# Patient Record
Sex: Female | Born: 1970 | Race: Black or African American | Hispanic: No | Smoking: Never smoker
Health system: Southern US, Community
[De-identification: ages and names within clinical notes are randomized; demographics above are authoritative.]

## PROBLEM LIST (undated history)

## (undated) DIAGNOSIS — R011 Cardiac murmur, unspecified: Secondary | ICD-10-CM

## (undated) DIAGNOSIS — G43019 Migraine without aura, intractable, without status migrainosus: Secondary | ICD-10-CM

## (undated) DIAGNOSIS — I10 Essential (primary) hypertension: Secondary | ICD-10-CM

## (undated) DIAGNOSIS — N83209 Unspecified ovarian cyst, unspecified side: Secondary | ICD-10-CM

## (undated) DIAGNOSIS — G43909 Migraine, unspecified, not intractable, without status migrainosus: Secondary | ICD-10-CM

## (undated) HISTORY — DX: Migraine, unspecified, not intractable, without status migrainosus: G43.909

## (undated) HISTORY — DX: Migraine without aura, intractable, without status migrainosus: G43.019

## (undated) HISTORY — PX: DILATION AND CURETTAGE OF UTERUS: SHX78

## (undated) HISTORY — PX: MYOMECTOMY: SHX85

## (undated) HISTORY — PX: BREAST SURGERY: SHX581

## (undated) HISTORY — PX: DIAGNOSTIC LAPAROSCOPY: SUR761

---

## 1998-08-01 ENCOUNTER — Emergency Department (HOSPITAL_COMMUNITY): Admission: EM | Admit: 1998-08-01 | Discharge: 1998-08-01 | Payer: Self-pay | Admitting: Emergency Medicine

## 2004-09-08 ENCOUNTER — Encounter (INDEPENDENT_AMBULATORY_CARE_PROVIDER_SITE_OTHER): Payer: Self-pay | Admitting: *Deleted

## 2004-09-08 ENCOUNTER — Ambulatory Visit (HOSPITAL_COMMUNITY): Admission: AD | Admit: 2004-09-08 | Discharge: 2004-09-08 | Payer: Self-pay | Admitting: *Deleted

## 2005-04-11 ENCOUNTER — Ambulatory Visit (HOSPITAL_COMMUNITY): Admission: RE | Admit: 2005-04-11 | Discharge: 2005-04-11 | Payer: Self-pay | Admitting: Obstetrics and Gynecology

## 2005-04-11 ENCOUNTER — Encounter (INDEPENDENT_AMBULATORY_CARE_PROVIDER_SITE_OTHER): Payer: Self-pay | Admitting: Specialist

## 2005-12-05 ENCOUNTER — Inpatient Hospital Stay (HOSPITAL_COMMUNITY): Admission: AD | Admit: 2005-12-05 | Discharge: 2005-12-15 | Payer: Self-pay | Admitting: Obstetrics and Gynecology

## 2005-12-22 ENCOUNTER — Inpatient Hospital Stay (HOSPITAL_COMMUNITY): Admission: AD | Admit: 2005-12-22 | Discharge: 2005-12-25 | Payer: Self-pay | Admitting: Obstetrics and Gynecology

## 2005-12-23 ENCOUNTER — Encounter (INDEPENDENT_AMBULATORY_CARE_PROVIDER_SITE_OTHER): Payer: Self-pay | Admitting: Specialist

## 2005-12-26 ENCOUNTER — Encounter: Admission: RE | Admit: 2005-12-26 | Discharge: 2006-01-22 | Payer: Self-pay | Admitting: Obstetrics and Gynecology

## 2006-01-23 ENCOUNTER — Encounter: Admission: RE | Admit: 2006-01-23 | Discharge: 2006-02-22 | Payer: Self-pay | Admitting: Obstetrics and Gynecology

## 2006-02-23 ENCOUNTER — Encounter: Admission: RE | Admit: 2006-02-23 | Discharge: 2006-03-24 | Payer: Self-pay | Admitting: Obstetrics and Gynecology

## 2006-03-25 ENCOUNTER — Encounter: Admission: RE | Admit: 2006-03-25 | Discharge: 2006-04-24 | Payer: Self-pay | Admitting: Obstetrics and Gynecology

## 2006-04-25 ENCOUNTER — Encounter: Admission: RE | Admit: 2006-04-25 | Discharge: 2006-05-24 | Payer: Self-pay | Admitting: Obstetrics and Gynecology

## 2006-05-25 ENCOUNTER — Encounter: Admission: RE | Admit: 2006-05-25 | Discharge: 2006-06-24 | Payer: Self-pay | Admitting: Obstetrics and Gynecology

## 2006-06-25 ENCOUNTER — Encounter: Admission: RE | Admit: 2006-06-25 | Discharge: 2006-07-25 | Payer: Self-pay | Admitting: Obstetrics and Gynecology

## 2006-07-26 ENCOUNTER — Encounter: Admission: RE | Admit: 2006-07-26 | Discharge: 2006-08-24 | Payer: Self-pay | Admitting: Obstetrics and Gynecology

## 2006-08-25 ENCOUNTER — Encounter: Admission: RE | Admit: 2006-08-25 | Discharge: 2006-09-24 | Payer: Self-pay | Admitting: Obstetrics and Gynecology

## 2006-09-25 ENCOUNTER — Encounter: Admission: RE | Admit: 2006-09-25 | Discharge: 2006-10-24 | Payer: Self-pay | Admitting: Obstetrics and Gynecology

## 2006-10-25 ENCOUNTER — Encounter: Admission: RE | Admit: 2006-10-25 | Discharge: 2006-11-24 | Payer: Self-pay | Admitting: Obstetrics and Gynecology

## 2006-11-25 ENCOUNTER — Encounter: Admission: RE | Admit: 2006-11-25 | Discharge: 2006-12-25 | Payer: Self-pay | Admitting: Obstetrics and Gynecology

## 2007-11-12 ENCOUNTER — Emergency Department (HOSPITAL_COMMUNITY): Admission: EM | Admit: 2007-11-12 | Discharge: 2007-11-12 | Payer: Self-pay | Admitting: Emergency Medicine

## 2009-06-17 ENCOUNTER — Emergency Department (HOSPITAL_COMMUNITY): Admission: EM | Admit: 2009-06-17 | Discharge: 2009-06-17 | Payer: Self-pay | Admitting: Emergency Medicine

## 2010-01-25 ENCOUNTER — Emergency Department (HOSPITAL_COMMUNITY): Admission: EM | Admit: 2010-01-25 | Discharge: 2010-01-25 | Payer: Self-pay | Admitting: Family Medicine

## 2010-08-23 ENCOUNTER — Encounter
Admission: RE | Admit: 2010-08-23 | Discharge: 2010-11-21 | Payer: Self-pay | Source: Home / Self Care | Attending: Family Medicine | Admitting: Family Medicine

## 2011-02-19 LAB — URINALYSIS, ROUTINE W REFLEX MICROSCOPIC
Bilirubin Urine: NEGATIVE
Ketones, ur: NEGATIVE mg/dL
Leukocytes, UA: NEGATIVE
Nitrite: NEGATIVE
Protein, ur: NEGATIVE mg/dL
Urobilinogen, UA: 2 mg/dL — ABNORMAL HIGH (ref 0.0–1.0)
pH: 6.5 (ref 5.0–8.0)

## 2011-02-19 LAB — DIFFERENTIAL
Basophils Absolute: 0 10*3/uL (ref 0.0–0.1)
Basophils Relative: 0 % (ref 0–1)
Eosinophils Relative: 0 % (ref 0–5)
Monocytes Absolute: 0.5 10*3/uL (ref 0.1–1.0)
Monocytes Relative: 11 % (ref 3–12)
Neutro Abs: 3.7 10*3/uL (ref 1.7–7.7)

## 2011-02-19 LAB — CBC
HCT: 36.2 % (ref 36.0–46.0)
Hemoglobin: 12.5 g/dL (ref 12.0–15.0)
Platelets: 224 10*3/uL (ref 150–400)
RBC: 4.2 MIL/uL (ref 3.87–5.11)

## 2011-02-19 LAB — URINE MICROSCOPIC-ADD ON

## 2011-02-19 LAB — BASIC METABOLIC PANEL
Calcium: 9 mg/dL (ref 8.4–10.5)
Creatinine, Ser: 0.57 mg/dL (ref 0.4–1.2)

## 2011-02-19 LAB — POCT PREGNANCY, URINE: Preg Test, Ur: NEGATIVE

## 2011-03-31 NOTE — Op Note (Signed)
NAME:  Kelli Tran, Kelli Tran                 ACCOUNT NO.:  [id number]   MEDICAL RECORD NO.:  [id number]          PATIENT TYPE:  MAT   LOCATION:  MATC                          [facility]:  [facility]   PHYSICIAN:  [place]. Earlene Plater, M.D.  DATE OF BIRTH:  [birth year]   DATE OF PROCEDURE:  09/08/2004  DATE OF DISCHARGE:                                 OPERATIVE REPORT   PREOPERATIVE DIAGNOSES:  Lower abdominal pain, early pregnancy with left  adnexal mass suspicious for ectopic pregnancy.   POSTOPERATIVE DIAGNOSES:  Left unruptured ectopic pregnancy.   PROCEDURE:  Open laparoscopy, left salpingostomy.   SURGEON:  Chester Holstein. Earlene Plater, M.D.   ANESTHESIA:  General.   FINDINGS:  Unruptured mid tubal left ectopic pregnancy otherwise normal  appearing tubes, ovaries, uterus, appendix, liver, gallbladder and stomach.   ESTIMATED BLOOD LOSS:  50 mL intraoperatively and about 100 mL of blood in  the pelvis at entry.   SPECIMENS:  Products of conception.   COMPLICATIONS:  None.   INDICATIONS FOR PROCEDURE:  The patient presented to the office today with  bleeding, six weeks pregnancy by last menstrual period.  Ultrasound showed a  2 cm left adnexal mass with only a small amount of fluid in the uterus and a  small amount of Kelli Tran fluid in the pelvis.  Quantitative HCG in the 700  range. The patient was complaining of increasing pain through the day and  after her quantitative results came back we discussed the situation further.  Options were discussed including following serial quantitative HCG for trend  to better diagnose versus immediate laparoscopy. Considering the patient's  pain, we decided to proceed with laparoscopy tonight. Operative risks were  discussed including infection, bleeding, damage to bowel, bladder,  surrounding organs and potential need for salpingectomy.   DESCRIPTION OF PROCEDURE:  The patient was taken to the operating room and  general anesthesia obtained. She was prepped and draped in  standard fashion.  Bladder emptied with a Foley catheter. The patient was placed in the [place]  stirrups. A transverse incision made in the umbilicus and carried sharply to  the fascia. The fascia was elevated and entered sharply as was the posterior  sheath and peritoneum. No underlying adhesions were noted although the  patient did have a history of previous laparoscopy for apparent  endometriosis. A pursestring suture of #0 Vicryl was placed around the  fascial defect, Hasson cannula inserted and secured. Pneumoperitoneum  obtained with CO2 gas.   Trendelenburg position obtained, 5 mm port placed in the left lower quadrant  and a 5 mm port placed 2 cm above the symphysis in the midline.  The bowel  was mobilized superiorly with blunt probes, pelvis inspected, above findings  noted.  The left tube was inspected and it was the obvious site of the  ectopic pregnancy. It was unruptured and the tube otherwise appeared normal.  Therefore I decided to proceed with salpingostomy.   Needle point cautery was used to incise the tube on the side opposite from  the mesosalpinx. At entry a small amount of blood returned from the  tube.  The dissection was extended proximally for a total incision length of about  2 cm. The graspers were inserted through the incision and the embryo removed  as an intact specimen.   The 5 mm port in the midline was converted to an 11 and an endobag inserted  and the embryo removed through this port.   The pelvis was irrigated and the tube inspected. There was slight bleeding  along the tubal incision which was made hemostatic with bipolar cautery.   The pelvis was irrigated, reinspected, no other abnormalities noted. The  tube was reinspected and was hemostatic. Therefore the procedure was  terminated. The left lower quadrant port was removed, the site was  hemostatic by laparoscopic inspection. A camera was placed in the inferior  port and the umbilical incision  inspected with a laparoscope and appeared  normal.   Then the midline ports were removed and gas released. The umbilical incision  was elevated with [employer] retractors and the fascia closed with the  previously placed pursestring suture. No intraabdominal contents herniated  through prior to closure.  The fascia was closed at the midline incision  above the symphysis with figure-of-eight suture of #0 Vicryl. The skin was  closed at each site with 4-0 Vicryl in subcuticular fashion.  The patient  tolerated the procedure well, there were no complications. She was taken to  the recovery room awake, alert in stable condition.   The patient and her family were given written and instructions after surgery  regarding the need for followup labwork to ensure resolution of the  pregnancy.     Wesl   WBD/MEDQ  D:  09/08/2004  T:  09/09/2004  Job:  [id number]

## 2011-03-31 NOTE — Discharge Summary (Signed)
NAME:  Kelli Tran, Kelli Tran                 ACCOUNT NO.:  [id number]   MEDICAL RECORD NO.:  [id number]          PATIENT TYPE:  INP   LOCATION:  9154                          [facility]ILITY:  [facility]   PHYSICIAN:  Maxie Better, M.D.DATE OF BIRTH:  01-20-1971   DATE OF ADMISSION:  12/05/2005  DATE OF DISCHARGE:  12/15/2005                                 DISCHARGE SUMMARY   ADMISSION DIAGNOSIS:  Preterm labor, intrauterine gestation of 29-2/7 weeks.   DISCHARGE DIAGNOSES:  Preterm labor, intrauterine gestation at 30-6/7 weeks,  undelivered.   HISTORY OF PRESENT ILLNESS:  A 40 year old gravida 4, para 1-0-2-1 female at  29-2/7 weeks presented with complaints of painful Braxton Hicks  contractions, and was found to be 1, 50% and presenting part down in the  pelvis.  The patient's prenatal course had been uncomplicated.  Ultrasound  on December 05, 2005, has shown estimated fetal weight of 1377 g, normal  amniotic fluids, index and cervical length of 1.2 cm.  The patient had a  previous term delivery.   HOSPITAL COURSE:  The patient had a fetal fibronectin that was negative on  December 05, 2005.  She was put on the monitor that shows a reactive tracing  with contractions.  The patient was placed on continuous fetal monitoring.  Group B strep culture was obtained.  Beta Methasone dose #2 was being given.  Procardia was started for her preterm labor.  Ultrasound was done for  cervical rent.  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  Repeat vaginal exam on December 08, 2005,  showed the patient was 1 cm dilated, 80% effaced, -2, ballooning low uterine  segment, but Group B strep ws negative.  Procardia was discontinued.  She  was betamethasone complete.  Maternal fetal medicine consultation was  obtained due to the Dandy-Walker variant issue.  The patient was continued  inpatient.  The issue of amniocentesis was discussed with the patient in  light of  this Dandy-Walker variant.  However, the amniocentesis issue was  deferred due to the prematurity of the baby.  The patient remained in-house.  Labile blood pressures were noted.  PIH labs  were normal.  The cervix  remained stable.  On hospital day #10, the patient was deemed able to be  discharged home.  Ultrasound of December 14, 2005, had  normal amniotic  fluids with cervix length to 4.7 with dilatations at 2.2 cm.   DISPOSITION:  Home.   CONDITION ON DISCHARGE:  Stable.   FOLLOWUP:  Follow up at [facility] on Tuesday.  Repeat fetal  fibronectin.   DISCHARGE INSTRUCTIONS:  1.  Preterm labor precautions, decreased fetal movement, vaginal bleeding.      Maxie Better, M.D.  Electronically Signed    [place]/MEDQ  D:  02/15/2006  T:  02/16/2006  Job:  [id number]

## 2011-03-31 NOTE — Op Note (Signed)
NAME:  Kelli Tran, Kelli Tran                 ACCOUNT NO.:  [id number]   MEDICAL RECORD NO.:  [id number]          PATIENT TYPE:  AMB   LOCATION:  SDC                           [facility]   PHYSICIAN:  Maxie Better, M.D.DATE OF BIRTH:  1971/04/12   DATE OF PROCEDURE:  04/11/2005  DATE OF DISCHARGE:                                 OPERATIVE REPORT   PREOPERATIVE DIAGNOSIS:  Abnormal pregnancy, right adnexal mass.   OPERATION/PROCEDURE:  1.  Diagnostic laparoscopy.  2.  Suction dilation and evacuation with frozen section.   POSTOPERATIVE DIAGNOSES:  1.  Blighted ovum.  2.  Right paratubal cyst.  3.  Bilateral salpingitis isthmic nodosa.   ANESTHESIA:  General.   SURGEON:  Maxie Better, M.D.   ASSISTANT:  None.   INDICATIONS:  This is a 40 year old, gravida 3, para 1-0-1-1, female with a  prior history of a left ectopic pregnancy who is 6-1/2 weeks amenorrhea, LMP  February 23, 2005, who presented for an ultrasound due to her prior history  after her quantitative hCG was over 12,000 on Apr 03, 2005.  The patient was  without any pain.  She denied any vaginal bleeding and it was a desired  pregnancy.  Ultrasound in the office revealed there was a sac in the uterus  without any yolk sac or fetal pole.  She had Kelli Tran fluid as well as a right  adnexal mass measuring 2 cm.  Accordingly the hCG was repeated today which  was 25,714.  Given the sonographic findings and the quantitative hCG and the  patient's prior history, decision was made to proceed with surgical  management.  The patient was advised that this was possibly a heterotopic  pregnancy.  She could also have a blighted ovum with an ovarian cyst as an  adnexal mass or she could have blighted ovum with no other findings intra-  abdominally.  However, in light of her obstetrical history, it was felt that  she would not only a dilatation and curettage but also the diagnostic  laparoscopy.  The risks and benefits of both  procedures were explained to  the patient and her husband.  Consent was signed.  The patient was  transferred to the operating room.   DESCRIPTION OF PROCEDURE:  Under adequate general anesthesia, the patient  was placed in the dorsal lithotomy position.  She was sterilely prepped and  draped in the usual fashion.  Indwelling Foley catheter was thoroughly  placed.  Examination under anesthesia revealed anteverted, normal-size  uterus.  No adnexal masses could be appreciated.  A bivalve speculum was  placed in the vagina.  Single-tooth tenaculum was placed on the anterior lip  of the cervix.  The cervix was parous.  Cervix was anteriorly dilated up to  #31 [facility] dilator.  A #7 mm curved suction cannula was introduced into the  uterine cavity and cavity was suctioned, curetted and then suctioned at  which time the specimen was sent for frozen section.  Acorn cannula was  introduced into the cervical os, attached to the tenaculum for manipulation  of the uterus.  Bivalve speculum was then removed and attention was then  turned to the abdomen.   While still maintaining sterile technique, 0.25% Marcaine was injected  infraumbilically and suprapubically.  An infraumbilical incision was then  made.  The Veress needle was introduced into the abdomen and tested using  normal saline.  Good placement was noted.  Three liters of carbon dioxide  was insufflated.  The opening pressure was 4.  A Veress needle was then  removed.  A 10 mm disposable trocar was easily introduced into abdomen  without incident.  A lighted video laparoscope was introduced through the  port.  A small incision was made suprapubically and a 5 mm port was placed  under direct visualization. A probe was then placed through that port.  The  uterus was bulky, slightly retroverted.  There was clear yellow fluid in the  posterior cul-de-sac.  The right tube was tortuous butotherwise normal and  the fimbriated end could be identified  and at the end was a cystic mass  consistent with a paratubal cyst.  The right ovary was normal.  The left  tube also had tortuous appearance consistent with bilateral salpingitis  isthmic nodosa.  The left ovary was normal.  No evidence or suggestion of  ectopic pregnancy in either tube was noted.  The peritoneal fluid was  aspirated.  The appendix was noted to be normal.  The liver edge was noted  to be normal.  At that point, the suprapubic port was removed.  The abdomen  was deflated. The infraumbilical port was removed under direct  visualization.  The fascia was identified in the infraumbilical site and  carefully brought up out of the field and 0 Vicryl figure-of-eight was  placed.  The skin in the infraumbilical area was approximated using 4-0  Vicryl suture and the suprapubic area was approximated using Dermabond.  The  instruments were all removed from the vagina.  The cervix was inspected.  Bleeding from the tenaculum site was made hemostatic with a clamp,  subsequently removed.   SPECIMENS:  Products of conception sent to pathology.   ESTIMATED BLOOD LOSS:  100 mL.   COMPLICATIONS:  None.   Maternal blood type was 0 positive.  The patient tolerated the procedure  well and was transferred to recovery in stable condition.      [place]/MEDQ  D:  04/11/2005  T:  04/12/2005  Job:  [id number]

## 2013-03-10 ENCOUNTER — Emergency Department (INDEPENDENT_AMBULATORY_CARE_PROVIDER_SITE_OTHER)
Admission: EM | Admit: 2013-03-10 | Discharge: 2013-03-10 | Disposition: A | Discharge status: Nursing Home (Includes ICF) | Payer: 59 | Source: Home / Self Care | Attending: Emergency Medicine | Admitting: Emergency Medicine

## 2013-03-10 ENCOUNTER — Encounter (HOSPITAL_COMMUNITY): Payer: Self-pay | Admitting: Emergency Medicine

## 2013-03-10 ENCOUNTER — Emergency Department (HOSPITAL_COMMUNITY): Payer: 59

## 2013-03-10 ENCOUNTER — Emergency Department (HOSPITAL_COMMUNITY)
Admission: EM | Admit: 2013-03-10 | Discharge: 2013-03-10 | Disposition: A | Discharge status: Home / Self Care | Payer: 59 | Attending: Emergency Medicine | Admitting: Emergency Medicine

## 2013-03-10 ENCOUNTER — Encounter (HOSPITAL_COMMUNITY): Payer: Self-pay | Admitting: *Deleted

## 2013-03-10 DIAGNOSIS — R079 Chest pain, unspecified: Secondary | ICD-10-CM

## 2013-03-10 DIAGNOSIS — G43909 Migraine, unspecified, not intractable, without status migrainosus: Secondary | ICD-10-CM | POA: Insufficient documentation

## 2013-03-10 DIAGNOSIS — M79609 Pain in unspecified limb: Secondary | ICD-10-CM | POA: Insufficient documentation

## 2013-03-10 DIAGNOSIS — Z8742 Personal history of other diseases of the female genital tract: Secondary | ICD-10-CM | POA: Insufficient documentation

## 2013-03-10 DIAGNOSIS — R0602 Shortness of breath: Secondary | ICD-10-CM | POA: Insufficient documentation

## 2013-03-10 DIAGNOSIS — R61 Generalized hyperhidrosis: Secondary | ICD-10-CM | POA: Insufficient documentation

## 2013-03-10 DIAGNOSIS — R110 Nausea: Secondary | ICD-10-CM | POA: Insufficient documentation

## 2013-03-10 DIAGNOSIS — Z79899 Other long term (current) drug therapy: Secondary | ICD-10-CM | POA: Insufficient documentation

## 2013-03-10 DIAGNOSIS — Z3202 Encounter for pregnancy test, result negative: Secondary | ICD-10-CM | POA: Insufficient documentation

## 2013-03-10 DIAGNOSIS — I10 Essential (primary) hypertension: Secondary | ICD-10-CM | POA: Insufficient documentation

## 2013-03-10 HISTORY — DX: Essential (primary) hypertension: I10

## 2013-03-10 HISTORY — DX: Unspecified ovarian cyst, unspecified side: N83.209

## 2013-03-10 LAB — URINALYSIS, ROUTINE W REFLEX MICROSCOPIC
Bilirubin Urine: NEGATIVE
Glucose, UA: NEGATIVE mg/dL
Ketones, ur: NEGATIVE mg/dL
pH: 5.5 (ref 5.0–8.0)

## 2013-03-10 LAB — CBC
HCT: 37.6 % (ref 36.0–46.0)
Hemoglobin: 14 g/dL (ref 12.0–15.0)
MCH: 29 pg (ref 26.0–34.0)
MCHC: 37.2 g/dL — ABNORMAL HIGH (ref 30.0–36.0)
RDW: 12.7 % (ref 11.5–15.5)

## 2013-03-10 LAB — BASIC METABOLIC PANEL
BUN: 10 mg/dL (ref 6–23)
Calcium: 9.7 mg/dL (ref 8.4–10.5)
GFR calc non Af Amer: >90 mL/min (ref >90)
Glucose, Bld: 90 mg/dL (ref 70–99)
Sodium: 140 mEq/L (ref 135–145)

## 2013-03-10 LAB — URINE MICROSCOPIC-ADD ON

## 2013-03-10 NOTE — ED Notes (Addendum)
Pt reports on Friday she had intermittent total facial numbness with bilateral jaw tightness, night sweats and unable to sleep well. Pt repots on Sunday her right lower leg began to hurt that radiated into groin. Pt reports left sided CP that radiates into left shoulder and into right shoulder X Friday, constant. Pt c/o dizziness spells. Pt c/o SOB with exertion that started on Saturday. Pt also states she has had some headaches, but sts she has hx of migraines. Pt in nad, pt denies numbness/tingling to arms/legs. Pt in nad, skin warm and dry, resp e/u.

## 2013-03-10 NOTE — ED Provider Notes (Signed)
History     CSN: [id number]  Arrival date & time 03/10/13  1128   First MD Initiated Contact with Patient 03/10/13 1210      Chief Complaint  Patient presents with  . Chest Pain    (Consider location/radiation/quality/duration/timing/severity/associated sxs/prior treatment) HPI Comments: Patient is a 41 onset.from the urgent care Center for evaluation of chest pain. And Friday, 3 days ago she's had tightness in aircraft from the left chest and around to her back and shoulder blades. He had sweating at night. She has had some shortness of breath. Her right leg and had some pain also there has been nausea but no vomiting and she had spells of dizziness. She had gone to the urgent care Center, had an EKG which was nonacute, and was sent to [facility] [place] for further evaluation. Her past history is noteworthy for hypertension and migraine headaches. She is normally followed by [facility] in [facility] [place].  Patient is a 42 y.o. female presenting with chest pain. The history is provided by the patient and medical records. No language interpreter was used.  Chest Pain Pain location:  Substernal area Pain quality: tightness   Pain radiates to:  Upper back, L shoulder and R shoulder Pain radiates to the back: yes   Pain severity:  Mild (Pain is rated at a 3 patient.) Onset quality:  Gradual Duration:  3 days Timing:  Intermittent Chronicity:  New Relieved by:  Nothing Worsened by:  Nothing tried Associated symptoms comment:  Hypertension, migraine headache. Risk factors: hypertension     Past Medical History  Diagnosis Date  . Hypertension   . Other and unspecified ovarian cysts     Past Surgical History  Procedure Laterality Date  . Myomectomy    . Breast surgery      No family history on file.  History  Substance Use Topics  . Smoking status: Never Smoker   . Smokeless tobacco: Not on file  . Alcohol Use: Yes    OB History   Grav Para Term Preterm  Abortions TAB SAB Ect Mult Living                  Review of Systems  Cardiovascular: Positive for chest pain.  Gastrointestinal: Negative.   Genitourinary: Negative.   Musculoskeletal: Negative.   Skin: Negative.   Neurological: Negative.   Psychiatric/Behavioral: Negative.     Allergies  Levaquin and Tessalon  Home Medications   Current Outpatient Rx  Name  Route  Sig  Dispense  Refill  . ibuprofen (ADVIL,MOTRIN) 200 MG tablet   Oral   Take 800 mg by mouth every 8 (eight) hours as needed for pain.         . metoprolol succinate (TOPROL-XL) 25 MG 24 hr tablet   Oral   Take 12.5 mg by mouth daily.           BP 130/89  Pulse 55  Temp(Src) 98.8 F (37.1 C)  Resp 17  SpO2 99%  Physical Exam  Nursing note and vitals reviewed. Constitutional: She is oriented to person, place, and time. She appears well-developed and well-nourished. No distress.  HENT:  Head: Normocephalic and atraumatic.  Right Ear: External ear normal.  Left Ear: External ear normal.  Mouth/Throat: Oropharynx is clear and moist.  Eyes: Conjunctivae and EOM are normal. Pupils are equal, round, and reactive to light. No scleral icterus.  Neck: Normal range of motion. Neck supple.  Cardiovascular: Normal rate, regular rhythm and  normal heart sounds.   Pulmonary/Chest: Effort normal and breath sounds normal.  Abdominal: Soft. Bowel sounds are normal.  Musculoskeletal: Normal range of motion. She exhibits no edema and no tenderness.  Neurological: She is alert and oriented to person, place, and time.  No sensory or motor deficit.  Skin: Skin is warm and dry.  Psychiatric: She has a normal mood and affect. Her behavior is normal.    ED Course  Procedures (including critical care time)   Date: 03/10/2013  Rate: 71  Rhythm: normal sinus rhythm and sinus arrhythmia  QRS Axis: normal  Intervals: normal  ST/T Wave abnormalities: nonspecific ST/T changes  Conduction Disutrbances:none   Narrative Interpretation: Abnormal EKG  Old EKG Reviewed: none available  2:39 PM Results for orders placed during the hospital encounter of 03/10/13  CBC      Result Value Range   WBC 5.2  4.0 - 10.5 K/uL   RBC 4.82  3.87 - 5.11 MIL/uL   Hemoglobin 14.0  12.0 - 15.0 g/dL   HCT 16.1  09.6 - 04.5 %   MCV 78.0  78.0 - 100.0 fL   MCH 29.0  26.0 - 34.0 pg   MCHC 37.2 (*) 30.0 - 36.0 g/dL   RDW 40.9  81.1 - 91.4 %   Platelets 271  150 - 400 K/uL  BASIC METABOLIC PANEL      Result Value Range   Sodium 140  135 - 145 mEq/L   Potassium 4.0  3.5 - 5.1 mEq/L   Chloride 107  96 - 112 mEq/L   CO2 24  19 - 32 mEq/L   Glucose, Bld 90  70 - 99 mg/dL   BUN 10  6 - 23 mg/dL   Creatinine, Ser 7.82  0.50 - 1.10 mg/dL   Calcium 9.7  8.4 - 95.6 mg/dL   GFR calc non Af Amer >90  >90 mL/min   GFR calc Af Amer >90  >90 mL/min  URINALYSIS, ROUTINE W REFLEX MICROSCOPIC      Result Value Range   Color, Urine YELLOW  YELLOW   APPearance CLEAR  CLEAR   Specific Gravity, Urine 1.019  1.005 - 1.030   pH 5.5  5.0 - 8.0   Glucose, UA NEGATIVE  NEGATIVE mg/dL   Hgb urine dipstick TRACE (*) NEGATIVE   Bilirubin Urine NEGATIVE  NEGATIVE   Ketones, ur NEGATIVE  NEGATIVE mg/dL   Protein, ur NEGATIVE  NEGATIVE mg/dL   Urobilinogen, UA 1.0  0.0 - 1.0 mg/dL   Nitrite NEGATIVE  NEGATIVE   Leukocytes, UA NEGATIVE  NEGATIVE  URINE MICROSCOPIC-ADD ON      Result Value Range   Squamous Epithelial / LPF RARE  RARE   WBC, UA 0-2  <3 WBC/hpf   RBC / HPF 0-2  <3 RBC/hpf   Bacteria, UA RARE  RARE  POCT I-STAT TROPONIN I      Result Value Range   Troponin i, poc 0.00  0.00 - 0.08 ng/mL   Comment 3           POCT PREGNANCY, URINE      Result Value Range   Preg Test, Ur NEGATIVE  NEGATIVE    Dg Chest 2 View  03/10/2013  *RADIOLOGY REPORT*  Clinical Data: 42 year old female chest pain.  Chest tightness.  CHEST - 2 VIEW  Comparison: None.  Findings: Normal lung volumes.  Cardiac size and mediastinal contours are  within normal limits.  Visualized tracheal air column is within normal limits.  No pneumothorax, pulmonary edema, pleural effusion or confluent pulmonary opacity. No acute osseous abnormality identified.  IMPRESSION: No acute cardiopulmonary abnormality.   Original Report Authenticated By: Erskine Speed, M.D.    2:40 PM Lab tests are reassuringly normal. Released, advised to followup at [facility].  1. Nonspecific chest pain        Carleene Cooper III, MD 03/10/13 1454

## 2013-03-10 NOTE — ED Provider Notes (Addendum)
History     CSN: [id number]  Arrival date & time 03/10/13  1019   First MD Initiated Contact with Patient 03/10/13 1046      Chief Complaint  Patient presents with  . Chest Pain    (Consider location/radiation/quality/duration/timing/severity/associated sxs/prior treatment) HPI Comments: Patient presents urgent care this morning, complaining of ongoing left-sided chest pain this started Thursday. Suddenly she does not recall any injuries falls or recent physical activity. The pain is sharp in character and is located to the anterior lateral area of her left chest. At times the pain also seemed to be radiating to both of her jaws. She has experienced also some tingling and numbness sensation around her lips and her face. No weakness of her upper or lower extremities. She describes that she also had right lower leg pain, which she took some Motrin and alleviate her pain. Patient denies any history of DVTs or pulmonary embolisms, recent surgery, blood dyscrasias, or recent air or throat trips. She does admit however that she has been diagnosed with high blood pressure but has not been taking her topical for the last 3 months at least. Patient denies any respiratory symptoms such as cough, upper nasal congestion, sore throat or fevers  As pain has not gone away over the weekend she decided to come in this morning to be checked. She also describes it along with her pain she has noticed that when she walks she gets a bit short of breath. She also admits to some intermittent sweating and feeling nauseous at times with dizziness as well.  Patient is a 42 y.o. female presenting with chest pain.  Chest Pain Pain location:  L chest and L lateral chest Pain quality: radiating and sharp   Pain radiates to:  Neck, precordial region, L jaw and R jaw Pain radiates to the back: yes   Pain severity:  Moderate Onset quality:  Sudden Timing:  Constant Progression:  Waxing and waning Chronicity:   New Context: at rest   Context: not breathing and not lifting   Associated symptoms: diaphoresis, dizziness, numbness and shortness of breath   Associated symptoms: no cough, no fatigue, no fever, no headache, no palpitations and no weakness     Past Medical History  Diagnosis Date  . Hypertension   . Other and unspecified ovarian cysts     Past Surgical History  Procedure Laterality Date  . Myomectomy    . Breast surgery      No family history on file.  History  Substance Use Topics  . Smoking status: Never Smoker   . Smokeless tobacco: Not on file  . Alcohol Use: Yes    OB History   Grav Para Term Preterm Abortions TAB SAB Ect Mult Living                  Review of Systems  Constitutional: Positive for diaphoresis. Negative for fever, activity change, appetite change, fatigue and unexpected weight change.  HENT: Negative for ear pain and neck stiffness.   Respiratory: Positive for shortness of breath. Negative for cough.   Cardiovascular: Positive for chest pain. Negative for palpitations and leg swelling.  Skin: Negative for color change, pallor, rash and wound.  Neurological: Positive for dizziness and numbness. Negative for tremors, syncope, facial asymmetry, speech difficulty, weakness and headaches.    Allergies  Levaquin and Tessalon  Home Medications   Current Outpatient Rx  Name  Route  Sig  Dispense  Refill  . ibuprofen (ADVIL,MOTRIN) 200  MG tablet   Oral   Take 800 mg by mouth every 8 (eight) hours as needed for pain.         . metoprolol succinate (TOPROL-XL) 25 MG 24 hr tablet   Oral   Take 12.5 mg by mouth daily.           BP 160/105  Pulse 88  Temp(Src) 98.4 F (36.9 C) (Oral)  Resp 16  SpO2 98%  Physical Exam  Nursing note and vitals reviewed. Constitutional: She appears well-developed and well-nourished. No distress.  HENT:  Head: Normocephalic.  Eyes: Conjunctivae are normal. Pupils are equal, round, and reactive to light.   Neck: Normal range of motion. Neck supple.  Cardiovascular: Normal rate and regular rhythm.  Exam reveals no gallop and no friction rub.   No murmur heard. Pulmonary/Chest: Effort normal and breath sounds normal. No respiratory distress. She has no wheezes. She exhibits no tenderness, no bony tenderness and no retraction.    Abdominal: Soft. There is no tenderness.  Neurological: She is alert. She has normal strength. No cranial nerve deficit or sensory deficit. She exhibits normal muscle tone. Coordination normal.  Skin: No rash noted. No erythema.    ED Course  Procedures (including critical care time)  Labs Reviewed - No data to display No results found.   1. Chest pain at rest     EKG at urgent care, normal sinus rhythm with a ventricular rate of 73 beats per minute PR and QRS duration within normal no acute T. prolongation. No ST or T wave elevation or inversion suggestive of chronic or acute ischemia.  MDM  Patient presents urgent care with atypical chest pain with a normal EKG and multi-symptomatic that includes symptoms such as paresthesias, nausea, diaphoresis, and dyspnea. Patient is hemodynamically stable initial EKG shows no signs of acute ischemia we'll transfer patient to the emergency department given the symptomatology patient presents cannot rule out unstable angina or even a thromboembolic pulmonary about.        Jimmie Molly, MD 03/10/13 1111  Jimmie Molly, MD 03/10/13 [phone]

## 2013-03-10 NOTE — ED Notes (Signed)
Patient in treatment room

## 2013-03-10 NOTE — ED Notes (Signed)
C/o chest pain, onset 4/25.  Pain in left sode of torso, under shoulder blade and under left breast.  Not sharp, described as achy, pull.  Nausea, no vomiting.  Sob with ambulation per patient.  Reports intermittent episodes of numbness to face and lips last Friday/saturday

## 2013-03-10 NOTE — ED Notes (Signed)
Pt is here with chest pain and tightness in upper back since Thursday.  Facial numbness intermittently.  Right leg pain that started in lower leg and then developed sharp pain in groin area.  Pt reports diaphoretic at night.  Spells of dizziness.  Pt reports some sob with exertion.  No history of clots.  Sent here for work up from [facility]

## 2014-04-07 ENCOUNTER — Encounter: Payer: Self-pay | Admitting: *Deleted

## 2014-04-07 ENCOUNTER — Encounter: Payer: Self-pay | Admitting: Neurology

## 2014-04-09 ENCOUNTER — Encounter: Payer: Self-pay | Admitting: Neurology

## 2014-04-09 ENCOUNTER — Encounter (INDEPENDENT_AMBULATORY_CARE_PROVIDER_SITE_OTHER): Payer: Self-pay

## 2014-04-09 ENCOUNTER — Ambulatory Visit (INDEPENDENT_AMBULATORY_CARE_PROVIDER_SITE_OTHER): Payer: 59 | Admitting: Neurology

## 2014-04-09 VITALS — BP 144/94 | HR 72 | Ht 61.0 in | Wt 142.0 lb

## 2014-04-09 DIAGNOSIS — G43019 Migraine without aura, intractable, without status migrainosus: Secondary | ICD-10-CM | POA: Insufficient documentation

## 2014-04-09 HISTORY — DX: Migraine without aura, intractable, without status migrainosus: G43.019

## 2014-04-09 MED ORDER — TOPIRAMATE 25 MG PO TABS: ORAL_TABLET | ORAL | Status: DC

## 2014-04-09 MED ORDER — FROVATRIPTAN SUCCINATE 2.5 MG PO TABS: 2.5000 mg | ORAL_TABLET | Freq: Two times a day (BID) | ORAL | Status: DC | PRN

## 2014-04-09 NOTE — Patient Instructions (Signed)
   When you get the headache, take Frova one tablet twice a day for 3 days.   Migraine Headache A migraine headache is an intense, throbbing pain on one or both sides of your head. A migraine can last for 30 minutes to several hours. CAUSES  The exact cause of a migraine headache is not always known. However, a migraine may be caused when nerves in the brain become irritated and release chemicals that cause inflammation. This causes pain. Certain things may also trigger migraines, such as:  Alcohol.  Smoking.  Stress.  Menstruation.  Aged cheeses.  Foods or drinks that contain nitrates, glutamate, aspartame, or tyramine.  Lack of sleep.  Chocolate.  Caffeine.  Hunger.  Physical exertion.  Fatigue.  Medicines used to treat chest pain (nitroglycerine), birth control pills, estrogen, and some blood pressure medicines. SIGNS AND SYMPTOMS  Pain on one or both sides of your head.  Pulsating or throbbing pain.  Severe pain that prevents daily activities.  Pain that is aggravated by any physical activity.  Nausea, vomiting, or both.  Dizziness.  Pain with exposure to bright lights, loud noises, or activity.  General sensitivity to bright lights, loud noises, or smells. Before you get a migraine, you may get warning signs that a migraine is coming (aura). An aura may include:  Seeing flashing lights.  Seeing bright spots, halos, or zig-zag lines.  Having tunnel vision or blurred vision.  Having feelings of numbness or tingling.  Having trouble talking.  Having muscle weakness. DIAGNOSIS  A migraine headache is often diagnosed based on:  Symptoms.  Physical exam.  A CT scan or MRI of your head. These imaging tests cannot diagnose migraines, but they can help rule out other causes of headaches. TREATMENT Medicines may be given for pain and nausea. Medicines can also be given to help prevent recurrent migraines.  HOME CARE INSTRUCTIONS  Only take  over-the-counter or prescription medicines for pain or discomfort as directed by your health care provider. The use of long-term narcotics is not recommended.  Lie down in a dark, quiet room when you have a migraine.  Keep a journal to find out what may trigger your migraine headaches. For example, write down:  What you eat and drink.  How much sleep you get.  Any change to your diet or medicines.  Limit alcohol consumption.  Quit smoking if you smoke.  Get 7 9 hours of sleep, or as recommended by your health care provider.  Limit stress.  Keep lights dim if bright lights bother you and make your migraines worse. SEEK IMMEDIATE MEDICAL CARE IF:   Your migraine becomes severe.  You have a fever.  You have a stiff neck.  You have vision loss.  You have muscular weakness or loss of muscle control.  You start losing your balance or have trouble walking.  You feel faint or pass out.  You have severe symptoms that are different from your first symptoms. MAKE SURE YOU:   Understand these instructions.  Will watch your condition.  Will get help right away if you are not doing well or get worse. Document Released: 10/30/2005 Document Revised: 08/20/2013 Document Reviewed: 07/07/2013 [facility] Patient Information 2014 [place].

## 2014-04-09 NOTE — Progress Notes (Signed)
Reason for visit: Migraine headache  Kelli Tran is a 43 y.o. female  History of present illness:  Ms. Paterson is a 43 year old right-handed black female with a history of migraine headaches that she has experienced since she was in seventh grade. The patient indicates that the headaches occur on average twice a month, but the headaches may last up to 1 week. The patient will generally will miss one or 2 days of work a month. She indicates that the headaches generally will start the left frontotemporal area, and in spread around the head. She will have some nausea and vomiting, and photophobia with and phonophobia with the headache. She may have some scalp tenderness with the headache. She indicates that the first 2 or 3 days of headache are quite severe, and headache becomes less severe as the days go on. She is on Depo-Provera shots, and she will generally get a headache within a week before her next shot is due. She takes Ultram and baclofen for the headache. In the past, she has been to the [facility] and gotten trigger point injections with some benefit. She has never been on any daily medications for her headache. She has been tried on Maxalt and Imitrex in the past, but she could not tolerate these medications. She indicates that flashing lights, missing meals, and stress may bring on headaches. There is a family history of headaches in one sister and the mother. The patient indicates that she had MRI evaluation of the brain done in April 2015, and she was told this was unremarkable. This study was done in [place], [place].   Past Medical History  Diagnosis Date  . Hypertension   . Other and unspecified ovarian cysts   . Migraine   . Migraine without aura, with intractable migraine, so stated, without mention of status migrainosus 04/09/2014    Past Surgical History  Procedure Laterality Date  . Myomectomy    . Breast surgery      Family  History  Problem Relation Age of Onset  . Hypertension Mother   . Diabetes Mother   . Migraines Mother   . Hypertension Father   . Breast cancer Sister   . Hypertension Sister   . Migraines Sister     Social history:  reports that she has never smoked. She has never used smokeless tobacco. She reports that she drinks alcohol. She reports that she does not use illicit drugs.  Medications:  Current Outpatient Prescriptions on File Prior to Visit  Medication Sig Dispense Refill  . amLODipine (NORVASC) 10 MG tablet Take 10 mg by mouth daily.      Marland Kitchen aspirin 81 MG tablet Take 81 mg by mouth daily.      . baclofen (LIORESAL) 10 MG tablet Take 10 mg by mouth 3 (three) times daily.      Marland Kitchen ibuprofen (ADVIL,MOTRIN) 200 MG tablet Take 800 mg by mouth every 8 (eight) hours as needed for pain.      Marland Kitchen losartan (COZAAR) 25 MG tablet Take 25 mg by mouth daily.      . medroxyPROGESTERone (DEPO-SUBQ PROVERA) 104 MG/0.65ML injection Inject 104 mg into the skin every 3 (three) months.      . Multiple Vitamin (MULTIVITAMIN) tablet Take 1 tablet by mouth daily.       No current facility-administered medications on file prior to visit.      Allergies  Allergen Reactions  . Levaquin [Levofloxacin In D5w]     "  inflammation" burning sensation, generalized  . Tessalon [Benzonatate] Swelling    ROS:  Out of a complete 14 system review of symptoms, the patient complains only of the following symptoms, and all other reviewed systems are negative.  Fatigue Headache, numbness Moles Insomnia  Blood pressure 144/94, pulse 72, height 5\' 1"  (1.549 m), weight 142 lb (64.411 kg).  Physical Exam  General: The patient is alert and cooperative at the time of the examination.  Eyes: Pupils are equal, round, and reactive to light. Discs are flat bilaterally.  Neck: The neck is supple, no carotid bruits are noted.  Respiratory: The respiratory examination is clear.  Cardiovascular: The cardiovascular  examination reveals a regular rate and rhythm, no obvious murmurs or rubs are noted.  Neuromuscular: Range of movement of the cervical spine is full. No crepitus is noted in the temporomandibular joints.  Skin: Extremities are without significant edema.  Neurologic Exam  Mental status: The patient is alert and oriented x 3 at the time of the examination. The patient has apparent normal recent and remote memory, with an apparently normal attention span and concentration ability.  Cranial nerves: Facial symmetry is present. There is good sensation of the face to pinprick and soft touch bilaterally. The strength of the facial muscles and the muscles to head turning and shoulder shrug are normal bilaterally. Speech is well enunciated, no aphasia or dysarthria is noted. Extraocular movements are full. Visual fields are full. The tongue is midline, and the patient has symmetric elevation of the soft palate. No obvious hearing deficits are noted.  Motor: The motor testing reveals 5 over 5 strength of all 4 extremities. Good symmetric motor tone is noted throughout.  Sensory: Sensory testing is intact to pinprick, soft touch, vibration sensation, and position sense on all 4 extremities. No evidence of extinction is noted.  Coordination: Cerebellar testing reveals good finger-nose-finger and heel-to-shin bilaterally.  Gait and station: Gait is normal. Tandem gait is normal. Romberg is negative. No drift is seen.  Reflexes: Deep tendon reflexes are symmetric and normal bilaterally. Toes are downgoing bilaterally.   Assessment/Plan:  One. Migraine headache  The patient is having prolonged migraine headaches lasting up to one week at a time, generally she will have at least 2 headaches a month. The patient will be placed on a daily medication such as Topamax, and she will be given Frova for the headache when the episode occurs. The patient has failed Maxalt and Imitrex. She will followup in 3-4  months.  Jill Alexanders MD 04/09/2014 3:24 PM  [facility] [address] [place], [place] [address]  Phone [phone] Fax [phone]

## 2014-08-10 ENCOUNTER — Ambulatory Visit: Payer: 59 | Admitting: Adult Health

## 2015-10-10 ENCOUNTER — Encounter (HOSPITAL_COMMUNITY): Payer: Self-pay | Admitting: *Deleted

## 2015-10-10 ENCOUNTER — Emergency Department (HOSPITAL_COMMUNITY)
Admission: EM | Admit: 2015-10-10 | Discharge: 2015-10-10 | Disposition: A | Discharge status: Home / Self Care | Payer: 59 | Source: Home / Self Care | Attending: Family Medicine | Admitting: Family Medicine

## 2015-10-10 DIAGNOSIS — H9203 Otalgia, bilateral: Secondary | ICD-10-CM

## 2015-10-10 DIAGNOSIS — J029 Acute pharyngitis, unspecified: Secondary | ICD-10-CM

## 2015-10-10 DIAGNOSIS — R51 Headache: Secondary | ICD-10-CM

## 2015-10-10 DIAGNOSIS — R519 Headache, unspecified: Secondary | ICD-10-CM

## 2015-10-10 LAB — POCT RAPID STREP A: Streptococcus, Group A Screen (Direct): NEGATIVE

## 2015-10-10 NOTE — ED Provider Notes (Signed)
CSN: [id number]     Arrival date & time 10/10/15  1735 History   None    Chief Complaint  Patient presents with  . Otalgia  . Sore Throat  . Headache   (Consider location/radiation/quality/duration/timing/severity/associated sxs/prior Treatment) Patient is a 44 y.o. female presenting with ear pain, pharyngitis, and headaches. The history is provided by the patient. No language interpreter was used.  Otalgia Location:  Bilateral Quality:  Aching Severity:  Mild Onset quality:  Gradual Duration:  1 day Timing:  Constant Progression:  Improving Chronicity:  New Context: not direct blow   Relieved by:  OTC medications Ineffective treatments:  None tried Associated symptoms: headaches   Associated symptoms: no cough, no fever, no hearing loss, no neck pain and no vomiting   Sore Throat This is a new problem. The current episode started yesterday. The problem occurs constantly. The problem has not changed since onset.Associated symptoms include headaches. Associated symptoms comments: Whenever she clears her throat she will brig up brownish mucus. Her daughter was recently treated for pharyngitis. Nothing aggravates the symptoms. The symptoms are relieved by ASA. She has tried acetaminophen for the symptoms. The treatment provided mild relief.  Headache Pain location:  L temporal and R temporal Radiates to:  Does not radiate Severity currently:  5/10 Duration:  1 day Timing:  Intermittent Progression:  Waxing and waning Chronicity:  New Similar to prior headaches: yes   Context: not activity and not exposure to bright light   Relieved by:  NSAIDs Worsened by:  Nothing Associated symptoms: ear pain   Associated symptoms: no blurred vision, no cough, no drainage, no fever, no hearing loss, no neck pain, no vomiting and no weakness     Past Medical History  Diagnosis Date  . Hypertension   . Other and unspecified ovarian cysts   . Migraine   . Migraine without aura, with  intractable migraine, so stated, without mention of status migrainosus 04/09/2014   Past Surgical History  Procedure Laterality Date  . Myomectomy    . Breast surgery     Family History  Problem Relation Age of Onset  . Hypertension Mother   . Diabetes Mother   . Migraines Mother   . Hypertension Father   . Breast cancer Sister   . Hypertension Sister   . Migraines Sister    Social History  Substance Use Topics  . Smoking status: Never Smoker   . Smokeless tobacco: Never Used  . Alcohol Use: No   OB History    No data available     Review of Systems  Constitutional: Negative for fever.  HENT: Positive for ear pain. Negative for hearing loss and postnasal drip.   Eyes: Negative for blurred vision.  Respiratory: Negative.  Negative for cough.   Cardiovascular: Negative.   Gastrointestinal: Negative.  Negative for vomiting.  Genitourinary: Negative.   Musculoskeletal: Negative for neck pain.  Neurological: Positive for headaches. Negative for syncope, weakness and light-headedness.  All other systems reviewed and are negative.   Allergies  Levaquin; Other; and Tessalon  Home Medications   Prior to Admission medications   Medication Sig Start Date End Date Taking? Authorizing Provider  amLODipine (NORVASC) 10 MG tablet Take 10 mg by mouth daily.   Yes Historical Provider, MD  aspirin 81 MG tablet Take 81 mg by mouth daily.   Yes Historical Provider, MD  baclofen (LIORESAL) 10 MG tablet Take 10 mg by mouth 3 (three) times daily.   Yes Historical Provider, MD  ibuprofen (ADVIL,MOTRIN) 200 MG tablet Take 800 mg by mouth every 8 (eight) hours as needed for pain.   Yes Historical Provider, MD  losartan (COZAAR) 25 MG tablet Take 25 mg by mouth daily.   Yes Historical Provider, MD  Multiple Vitamin (MULTIVITAMIN) tablet Take 1 tablet by mouth daily.   Yes Historical Provider, MD  norethindrone (AYGESTIN) 5 MG tablet Take 5 mg by mouth daily.   Yes Historical Provider, MD   frovatriptan (FROVA) 2.5 MG tablet Take 1 tablet (2.5 mg total) by mouth 2 (two) times daily as needed for migraine. If recurs, may repeat after 2 hours. Max of 3 tabs in 24 hours. 04/09/14   Kathrynn Ducking, MD  medroxyPROGESTERone (DEPO-SUBQ PROVERA) 104 MG/0.65ML injection Inject 104 mg into the skin every 3 (three) months.    Historical Provider, MD  topiramate (TOPAMAX) 25 MG tablet Take one tablet at night for one week, then take 2 tablets at night for one week, then take 3 tablets at night. 04/09/14   Kathrynn Ducking, MD  traMADol (ULTRAM) 50 MG tablet Take 50 mg by mouth daily. 01/24/14   Historical Provider, MD   Meds Ordered and Administered this Visit  Medications - No data to display  BP 135/89 mmHg  Pulse 74  Temp(Src) 98.3 F (36.8 C) (Oral)  SpO2 100%  LMP 10/08/2015 (Exact Date) No data found.   Physical Exam  Constitutional: She appears well-developed. No distress.  HENT:  Head: Normocephalic and atraumatic.  Right Ear: Tympanic membrane and ear canal normal. No drainage or tenderness. Tympanic membrane is not injected, not erythematous, not retracted and not bulging.  Left Ear: Tympanic membrane, external ear and ear canal normal. No drainage or tenderness. Tympanic membrane is not injected, not erythematous, not retracted and not bulging.  Mouth/Throat: Uvula is midline, oropharynx is clear and moist and mucous membranes are normal.  Eyes: Right eye exhibits no discharge. Left eye exhibits no discharge.  Neck: Neck supple.  Cardiovascular: Normal rate, regular rhythm, normal heart sounds and intact distal pulses.   No murmur heard. Pulmonary/Chest: Effort normal and breath sounds normal. No respiratory distress. She has no wheezes.  Abdominal: Soft. Bowel sounds are normal. She exhibits no distension and no mass. There is no tenderness.  Lymphadenopathy:    She has no cervical adenopathy.  Nursing note and vitals reviewed.   ED Course  Procedures (including  critical care time)  Labs Review Labs Reviewed - No data to display  Imaging Review No results found.   Visual Acuity Review  Right Eye Distance:   Left Eye Distance:   Bilateral Distance:    Right Eye Near:   Left Eye Near:    Bilateral Near:         MDM  No diagnosis found. Otalgia of both ears  Nonintractable headache, unspecified chronicity pattern, unspecified headache type  Pharyngitis  Patient likely has viral syndrome. Rapid strep done today was negative. Patient reassured she does not need A/B treatment at the moment. Tylenol recommended prn pain. Return precaution discussed.    Kinnie Feil, MD 10/10/15 [phone]

## 2015-10-10 NOTE — Discharge Instructions (Signed)
It was nice seeing you today. It seems you have viral syndrome causing ear arche, and throat pain as well as headache. This usually last 7-14 days and then resolve on it's own. Please use Tylenol as needed for pain. Follow up with [place] as needed.   Pharyngitis Pharyngitis is a sore throat (pharynx). There is redness, pain, and swelling of your throat. HOME CARE   Drink enough fluids to keep your pee (urine) clear or pale yellow.  Only take medicine as told by your doctor.  You may get sick again if you do not take medicine as told. Finish your medicines, even if you start to feel better.  Do not take aspirin.  Rest.  Rinse your mouth (gargle) with salt water ( tsp of salt per 1 qt of water) every 1-2 hours. This will help the pain.  If you are not at risk for choking, you can suck on hard candy or sore throat lozenges. GET HELP IF:  You have large, tender lumps on your neck.  You have a rash.  You cough up green, yellow-brown, or bloody spit. GET HELP RIGHT AWAY IF:   You have a stiff neck.  You drool or cannot swallow liquids.  You throw up (vomit) or are not able to keep medicine or liquids down.  You have very bad pain that does not go away with medicine.  You have problems breathing (not from a stuffy nose). MAKE SURE YOU:   Understand these instructions.  Will watch your condition.  Will get help right away if you are not doing well or get worse.   This information is not intended to replace advice given to you by your health care provider. Make sure you discuss any questions you have with your health care provider.   Document Released: 04/17/2008 Document Revised: 08/20/2013 Document Reviewed: 07/07/2013 Elsevier Interactive Patient Education [employer].

## 2015-10-10 NOTE — ED Notes (Signed)
C/O bilat earache, intermittent "plugged" ears, nasal congestion, HA since yesterday.  Has felt slightly feverish today.  States only minimal cough, but when she coughs it is productive.

## 2015-10-12 LAB — CULTURE, GROUP A STREP: STREP A CULTURE: NEGATIVE

## 2015-10-12 NOTE — ED Notes (Signed)
Final report of strep testing negative for strep

## 2015-10-17 ENCOUNTER — Emergency Department (HOSPITAL_COMMUNITY)
Admission: EM | Admit: 2015-10-17 | Discharge: 2015-10-17 | Disposition: A | Discharge status: Home / Self Care | Payer: 59 | Attending: Emergency Medicine | Admitting: Emergency Medicine

## 2015-10-17 ENCOUNTER — Encounter (HOSPITAL_COMMUNITY): Payer: Self-pay | Admitting: *Deleted

## 2015-10-17 ENCOUNTER — Emergency Department (HOSPITAL_COMMUNITY): Payer: 59

## 2015-10-17 DIAGNOSIS — R499 Unspecified voice and resonance disorder: Secondary | ICD-10-CM | POA: Insufficient documentation

## 2015-10-17 DIAGNOSIS — Z79899 Other long term (current) drug therapy: Secondary | ICD-10-CM | POA: Diagnosis not present

## 2015-10-17 DIAGNOSIS — G43909 Migraine, unspecified, not intractable, without status migrainosus: Secondary | ICD-10-CM | POA: Diagnosis not present

## 2015-10-17 DIAGNOSIS — I10 Essential (primary) hypertension: Secondary | ICD-10-CM | POA: Diagnosis not present

## 2015-10-17 DIAGNOSIS — Z79818 Long term (current) use of other agents affecting estrogen receptors and estrogen levels: Secondary | ICD-10-CM | POA: Diagnosis not present

## 2015-10-17 DIAGNOSIS — J029 Acute pharyngitis, unspecified: Secondary | ICD-10-CM | POA: Diagnosis present

## 2015-10-17 DIAGNOSIS — R1310 Dysphagia, unspecified: Secondary | ICD-10-CM | POA: Diagnosis not present

## 2015-10-17 DIAGNOSIS — R252 Cramp and spasm: Secondary | ICD-10-CM | POA: Diagnosis not present

## 2015-10-17 DIAGNOSIS — H9203 Otalgia, bilateral: Secondary | ICD-10-CM | POA: Insufficient documentation

## 2015-10-17 DIAGNOSIS — Z7982 Long term (current) use of aspirin: Secondary | ICD-10-CM | POA: Diagnosis not present

## 2015-10-17 DIAGNOSIS — Z8742 Personal history of other diseases of the female genital tract: Secondary | ICD-10-CM | POA: Diagnosis not present

## 2015-10-17 DIAGNOSIS — J020 Streptococcal pharyngitis: Secondary | ICD-10-CM | POA: Diagnosis not present

## 2015-10-17 LAB — CBC WITH DIFFERENTIAL/PLATELET
Basophils Absolute: 0 K/uL (ref 0.0–0.1)
Basophils Relative: 0 %
Eosinophils Absolute: 0.1 K/uL (ref 0.0–0.7)
Eosinophils Relative: 0 %
HCT: 37.6 % (ref 36.0–46.0)
Hemoglobin: 13.4 g/dL (ref 12.0–15.0)
Lymphocytes Relative: 9 %
Lymphs Abs: 1.3 K/uL (ref 0.7–4.0)
MCH: 28.5 pg (ref 26.0–34.0)
MCHC: 35.6 g/dL (ref 30.0–36.0)
MCV: 79.8 fL (ref 78.0–100.0)
Monocytes Absolute: 1 K/uL (ref 0.1–1.0)
Monocytes Relative: 7 %
Neutro Abs: 12.3 K/uL — ABNORMAL HIGH (ref 1.7–7.7)
Neutrophils Relative %: 84 %
Platelets: 331 K/uL (ref 150–400)
RBC: 4.71 MIL/uL (ref 3.87–5.11)
RDW: 13.4 % (ref 11.5–15.5)
WBC: 14.6 K/uL — ABNORMAL HIGH (ref 4.0–10.5)

## 2015-10-17 LAB — RAPID STREP SCREEN (MED CTR MEBANE ONLY): Streptococcus, Group A Screen (Direct): POSITIVE — AB

## 2015-10-17 LAB — BASIC METABOLIC PANEL WITH GFR
Anion gap: 10 (ref 5–15)
BUN: 7 mg/dL (ref 6–20)
CO2: 23 mmol/L (ref 22–32)
Calcium: 8.9 mg/dL (ref 8.9–10.3)
Chloride: 103 mmol/L (ref 101–111)
Creatinine, Ser: 0.67 mg/dL (ref 0.44–1.00)
GFR calc Af Amer: >60 mL/min
GFR calc non Af Amer: >60 mL/min
Glucose, Bld: 84 mg/dL (ref 65–99)
Potassium: 3.6 mmol/L (ref 3.5–5.1)
Sodium: 136 mmol/L (ref 135–145)

## 2015-10-17 MED ORDER — PENICILLIN V POTASSIUM 500 MG PO TABS: 500.0000 mg | ORAL_TABLET | Freq: Two times a day (BID) | ORAL | Status: AC

## 2015-10-17 MED ORDER — CLINDAMYCIN PHOSPHATE 900 MG/50ML IV SOLN
900.0000 mg | Freq: Once | INTRAVENOUS | Status: AC
  Administered 2015-10-17: 900 mg via INTRAVENOUS
  Filled 2015-10-17: qty 50

## 2015-10-17 MED ORDER — IOHEXOL 300 MG/ML  SOLN
100.0000 mL | Freq: Once | INTRAMUSCULAR | Status: AC | PRN
  Administered 2015-10-17: 75 mL via INTRAVENOUS

## 2015-10-17 MED ORDER — PREDNISONE 10 MG PO TABS: 10.0000 mg | ORAL_TABLET | Freq: Every day | ORAL | Status: DC

## 2015-10-17 MED ORDER — DEXAMETHASONE SODIUM PHOSPHATE 10 MG/ML IJ SOLN
10.0000 mg | Freq: Once | INTRAMUSCULAR | Status: AC
  Administered 2015-10-17: 10 mg via INTRAVENOUS
  Filled 2015-10-17: qty 1

## 2015-10-17 MED ORDER — KETOROLAC TROMETHAMINE 30 MG/ML IJ SOLN
60.0000 mg | Freq: Once | INTRAMUSCULAR | Status: AC
  Administered 2015-10-17: 60 mg via INTRAMUSCULAR
  Filled 2015-10-17: qty 2

## 2015-10-17 NOTE — ED Notes (Signed)
Was seen at [facility] on 11/27 for sore throat and was negative for strep. Began feeling better and then onset again last night of sore throat and today feels like throat is swelling shut. spo2 98% at triage and pt is able to swallow but with severe pain.

## 2015-10-17 NOTE — Discharge Instructions (Signed)
Pharyngitis Pharyngitis is redness, pain, and swelling (inflammation) of your pharynx.  CAUSES  Pharyngitis is usually caused by infection. Most of the time, these infections are from viruses (viral) and are part of a cold. However, sometimes pharyngitis is caused by bacteria (bacterial). Pharyngitis can also be caused by allergies. Viral pharyngitis may be spread from person to person by coughing, sneezing, and personal items or utensils (cups, forks, spoons, toothbrushes). Bacterial pharyngitis may be spread from person to person by more intimate contact, such as kissing.  SIGNS AND SYMPTOMS  Symptoms of pharyngitis include:   Sore throat.   Tiredness (fatigue).   Low-grade fever.   Headache.  Joint pain and muscle aches.  Skin rashes.  Swollen lymph nodes.  Plaque-like film on throat or tonsils (often seen with bacterial pharyngitis). DIAGNOSIS  Your health care provider will ask you questions about your illness and your symptoms. Your medical history, along with a physical exam, is often all that is needed to diagnose pharyngitis. Sometimes, a rapid strep test is done. Other lab tests may also be done, depending on the suspected cause.  TREATMENT  Viral pharyngitis will usually get better in 3-4 days without the use of medicine. Bacterial pharyngitis is treated with medicines that kill germs (antibiotics).  HOME CARE INSTRUCTIONS   Drink enough water and fluids to keep your urine clear or pale yellow.   Only take over-the-counter or prescription medicines as directed by your health care provider:   If you are prescribed antibiotics, make sure you finish them even if you start to feel better.   Do not take aspirin.   Get lots of rest.   Gargle with 8 oz of salt water ( tsp of salt per 1 qt of water) as often as every 1-2 hours to soothe your throat.   Throat lozenges (if you are not at risk for choking) or sprays may be used to soothe your throat. SEEK MEDICAL  CARE IF:   You have large, tender lumps in your neck.  You have a rash.  You cough up green, yellow-brown, or bloody spit. SEEK IMMEDIATE MEDICAL CARE IF:   Your neck becomes stiff.  You drool or are unable to swallow liquids.  You vomit or are unable to keep medicines or liquids down.  You have severe pain that does not go away with the use of recommended medicines.  You have trouble breathing (not caused by a stuffy nose). MAKE SURE YOU:   Understand these instructions.  Will watch your condition.  Will get help right away if you are not doing well or get worse.   This information is not intended to replace advice given to you by your health care provider. Make sure you discuss any questions you have with your health care provider.   Document Released: 10/30/2005 Document Revised: 08/20/2013 Document Reviewed: 07/07/2013 Elsevier Interactive Patient Education 2016 [employer]  Strep Throat Strep throat is a bacterial infection of the throat. Your health care provider may call the infection tonsillitis or pharyngitis, depending on whether there is swelling in the tonsils or at the back of the throat. Strep throat is most common during the cold months of the year in children who are 10-105 years of age, but it can happen during any season in people of any age. This infection is spread from person to person (contagious) through coughing, sneezing, or close contact. CAUSES Strep throat is caused by the bacteria called Streptococcus pyogenes. RISK FACTORS This condition is more likely to  develop in:  People who spend time in crowded places where the infection can spread easily.  People who have close contact with someone who has strep throat. SYMPTOMS Symptoms of this condition include:  Fever or chills.   Redness, swelling, or pain in the tonsils or throat.  Pain or difficulty when swallowing.  White or yellow spots on the tonsils or throat.  Swollen, tender glands  in the neck or under the jaw.  Red rash all over the body (rare). DIAGNOSIS This condition is diagnosed by performing a rapid strep test or by taking a swab of your throat (throat culture test). Results from a rapid strep test are usually ready in a few minutes, but throat culture test results are available after one or two days. TREATMENT This condition is treated with antibiotic medicine. HOME CARE INSTRUCTIONS Medicines  Take over-the-counter and prescription medicines only as told by your health care provider.  Take your antibiotic as told by your health care provider. Do not stop taking the antibiotic even if you start to feel better.  Have family members who also have a sore throat or fever tested for strep throat. They may need antibiotics if they have the strep infection. Eating and Drinking  Do not share food, drinking cups, or personal items that could cause the infection to spread to other people.  If swallowing is difficult, try eating soft foods until your sore throat feels better.  Drink enough fluid to keep your urine clear or pale yellow. General Instructions  Gargle with a salt-water mixture 3-4 times per day or as needed. To make a salt-water mixture, completely dissolve -1 tsp of salt in 1 cup of warm water.  Make sure that all household members wash their hands well.  Get plenty of rest.  Stay home from school or work until you have been taking antibiotics for 24 hours.  Keep all follow-up visits as told by your health care provider. This is important. SEEK MEDICAL CARE IF:  The glands in your neck continue to get bigger.  You develop a rash, cough, or earache.  You cough up a thick liquid that is green, yellow-brown, or bloody.  You have pain or discomfort that does not get better with medicine.  Your problems seem to be getting worse rather than better.  You have a fever. SEEK IMMEDIATE MEDICAL CARE IF:  You have new symptoms, such as vomiting,  severe headache, stiff or painful neck, chest pain, or shortness of breath.  You have severe throat pain, drooling, or changes in your voice.  You have swelling of the neck, or the skin on the neck becomes red and tender.  You have signs of dehydration, such as fatigue, dry mouth, and decreased urination.  You become increasingly sleepy, or you cannot wake up completely.  Your joints become red or painful.   This information is not intended to replace advice given to you by your health care provider. Make sure you discuss any questions you have with your health care provider.  Follow up with your primary care provider in 24-48 hours for re-evaluation. Return to the Emergency Department if you experience worsening of your symptoms, increased fever, difficulty breathing or swallowing, inability to open your mouth.

## 2015-10-17 NOTE — ED Provider Notes (Signed)
44 year old female comes in today complaining of sore throat. She states it has been present since last week but worsened last night. Feels more like it is on the left than on the right. To swallow and breathe but complains of pain with speaking.   Well-nourished female. In Any Acute Distress She Appears to Have Some Erythema Bilateral Posterior Pharynx without Any Lateralized Swelling. Plan CT of neck.   I performed a history and physical examination of Deshundra M Yott-Free and discussed her management with Ms. Dowless .  I agree with the history, physical, assessment, and plan of care, with the following exceptions: None  I was present for the following procedures: None Time Spent in Critical Care of the patient: None Time spent in discussions with the patient and family: 7  RAY,DANIELLE Shaune Pollack, MD 10/17/15 1425

## 2015-10-17 NOTE — ED Provider Notes (Signed)
CSN: [id number]     Arrival date & time 10/17/15  1303 History   First MD Initiated Contact with Patient 10/17/15 1333     Chief Complaint  Patient presents with  . Sore Throat     (Consider location/radiation/quality/duration/timing/severity/associated sxs/prior Treatment) HPI  Pt is a 44 y.o F who presents to the ED today c/o sore throat. Patient states that last week she began to experience sore throat and bilateral ear pain with subjective fevers. Patient was seen in an urgent care at that time was told this was a viral pharyngitis. Her strep test at that time was negative. Patient began to symptomatically improve over the next week. However, yesterday patient woke up with severe sore throat, odynophagia, voice changes and trismus. Patient is also having subjective fevers and chills. No vomiting, diarrhea, lip or tongue swelling, or otalgia.  Past Medical History  Diagnosis Date  . Hypertension   . Other and unspecified ovarian cysts   . Migraine   . Migraine without aura, with intractable migraine, so stated, without mention of status migrainosus 04/09/2014   Past Surgical History  Procedure Laterality Date  . Myomectomy    . Breast surgery     Family History  Problem Relation Age of Onset  . Hypertension Mother   . Diabetes Mother   . Migraines Mother   . Hypertension Father   . Breast cancer Sister   . Hypertension Sister   . Migraines Sister    Social History  Substance Use Topics  . Smoking status: Never Smoker   . Smokeless tobacco: Never Used  . Alcohol Use: No   OB History    No data available     Review of Systems  HENT: Positive for sore throat, trouble swallowing and voice change. Negative for drooling, rhinorrhea and tinnitus.   All other systems reviewed and are negative.     Allergies  Levaquin; Other; and Tessalon  Home Medications   Prior to Admission medications   Medication Sig Start Date End Date Taking? Authorizing Provider   acetaminophen (TYLENOL) 325 MG tablet Take 650 mg by mouth every 6 (six) hours as needed (pain).   Yes Historical Provider, MD  amLODipine (NORVASC) 10 MG tablet Take 10 mg by mouth daily.   Yes Historical Provider, MD  aspirin 81 MG tablet Take 81 mg by mouth daily.   Yes Historical Provider, MD  frovatriptan (FROVA) 2.5 MG tablet Take 1 tablet (2.5 mg total) by mouth 2 (two) times daily as needed for migraine. If recurs, may repeat after 2 hours. Max of 3 tabs in 24 hours. 04/09/14  Yes Kathrynn Ducking, MD  ibuprofen (ADVIL,MOTRIN) 200 MG tablet Take 800 mg by mouth every 8 (eight) hours as needed for pain.   Yes Historical Provider, MD  letrozole (FEMARA) 2.5 MG tablet Take 2.5 mg by mouth daily.   Yes Historical Provider, MD  losartan (COZAAR) 25 MG tablet Take 25 mg by mouth daily.   Yes Historical Provider, MD  Multiple Vitamin (MULTIVITAMIN) tablet Take 1 tablet by mouth daily.   Yes Historical Provider, MD  norethindrone (AYGESTIN) 5 MG tablet Take 5 mg by mouth daily.   Yes Historical Provider, MD   BP 128/85 mmHg  Pulse 82  Temp(Src) 98.7 F (37.1 C) (Oral)  Resp 18  SpO2 98%  LMP 10/08/2015 (Exact Date) Physical Exam  Constitutional: She is oriented to person, place, and time. She appears well-developed and well-nourished. No distress.  HENT:  Head: Normocephalic and  atraumatic.  Right Ear: External ear normal.  Left Ear: External ear normal.  Nose: Nose normal.  Mouth/Throat: There is trismus in the jaw. Uvula swelling present. Posterior oropharyngeal edema and posterior oropharyngeal erythema present.  Eyes: Conjunctivae and EOM are normal. Pupils are equal, round, and reactive to light. Right eye exhibits no discharge. Left eye exhibits no discharge. No scleral icterus.  Neck: Normal range of motion. Neck supple. No tracheal deviation present. No thyromegaly present.  Cardiovascular: Normal rate.   Pulmonary/Chest: Effort normal. No stridor. No respiratory distress.   Lymphadenopathy:    She has cervical adenopathy.  Neurological: She is alert and oriented to person, place, and time. Coordination normal.  Skin: Skin is warm and dry. No rash noted. She is not diaphoretic. No erythema. No pallor.  Psychiatric: She has a normal mood and affect. Her behavior is normal.  Nursing note and vitals reviewed.   ED Course  Procedures (including critical care time) Labs Review Labs Reviewed  RAPID STREP SCREEN (NOT AT [facility]) - Abnormal; Notable for the following:    Streptococcus, Group A Screen (Direct) POSITIVE (*)    All other components within normal limits  CBC WITH DIFFERENTIAL/PLATELET - Abnormal; Notable for the following:    WBC 14.6 (*)    Neutro Abs 12.3 (*)    All other components within normal limits  BASIC METABOLIC PANEL    Imaging Review Ct Soft Tissue Neck W Contrast  10/17/2015  CLINICAL DATA:  Evaluate for peritonsillar abscess. Sore throat and pain for 1 week. EXAM: CT NECK WITH CONTRAST TECHNIQUE: Multidetector CT imaging of the neck was performed using the standard protocol following the bolus administration of intravenous contrast. CONTRAST:  53mL OMNIPAQUE IOHEXOL 300 MG/ML  SOLN COMPARISON:  None. FINDINGS: Pharynx and larynx: There are inflamed BILATERAL palatine tonsils, greater on the LEFT. Striated pattern of enhancement (image 43 series 201) relatively specific for nonsuppurative tonsillitis. Air is seen within a small crypt of the LEFT palatine tonsil. There is no tonsillar abscess or peritonsillar abscess. Mild fullness of the LEFT aryepiglottic fold is also noted. No epiglottic thickening. Airway widely patent. Salivary glands: Unremarkable. Thyroid: No thyroid masses. Lymph nodes: Reactive BILATERAL adenopathy most notable in the LEFT greater than RIGHT level II stations. Vascular: Patent.  No signs of internal jugular vein thrombosis. Limited intracranial: Unremarkable. Visualized orbits: Negative. Mastoids and visualized paranasal  sinuses: No sinus or mastoid disease. Skeleton: No osseous disease. Upper chest: Lung apices clear. IMPRESSION: BILATERAL LEFT greater than RIGHT nonsuppurative tonsillitis. No peritonsillar or tonsillar abscess. Mild mucosal edema along the LEFT aryepiglottic fold. No airway compromise. Reactive cervical lymph nodes, a common accompanying finding. Electronically Signed   By: Staci Righter M.D.   On: 10/17/2015 16:48   I have personally reviewed and evaluated these images and lab results as part of my medical decision-making.   EKG Interpretation None      MDM   Final diagnoses:  Strep pharyngitis    44 y.o F presents with sore throat, trismus, odynophagia onset yesterday. On exam, throat is severely edematous and erythematous. Due to trismus, unable to determine if there is a PTA present. Pt is strep positive. Plan to CT neck to r/o . Pt given IV clinda and decadron. WBC 14.6. Pt is afebrile. Pt reports symptomatic improvement after intervention.  CT neck reveals nonsuppurative tonisilitis. NO evidence of PTA. No airway compromise. Will d/c home with PCN and prednisone. Discussed treatment plan with pt who is agreeable. Return precautions outlined in  patient discharge instructions. Pt is hemodynamically stable and ready for discharge.   Patient was discussed with and seen by Dr. Jeanell Sparrow who agrees with the treatment plan.      Dondra Spry [place], PA-C 10/18/15 [id number]  Pattricia Boss, MD 10/19/15 [phone]

## 2016-05-04 ENCOUNTER — Other Ambulatory Visit: Payer: Self-pay | Admitting: Obstetrics and Gynecology

## 2016-05-26 NOTE — Patient Instructions (Addendum)
Your procedure is scheduled on:  Thursday, June 01, 2016  Enter through the Main Entrance of [facility] at:  6:00 AM  Pick up the phone at the desk and dial [phone].  Call this number if you have problems the morning of surgery: [phone].  Remember: Do NOT eat food or drink after:  Midnight Wednesday, July 19  Take these medicines the morning of surgery with a SIP OF WATER:  Amlodipine, Valium if needed  Do NOT wear jewelry (body piercing), metal hair clips/bobby pins, make-up, or nail polish. Do NOT wear lotions, powders, or perfumes.  You may wear deodorant. Do NOT shave for 48 hours prior to surgery. Do NOT bring valuables to the hospital. Contacts, dentures, or bridgework may not be worn into surgery.  Leave suitcase in car.  After surgery it may be brought to your room.  For patients admitted to the hospital, checkout time is 11:00 AM the day of discharge.

## 2016-05-29 ENCOUNTER — Other Ambulatory Visit: Payer: Self-pay

## 2016-05-29 ENCOUNTER — Encounter (HOSPITAL_COMMUNITY)
Admission: RE | Admit: 2016-05-29 | Discharge: 2016-05-29 | Disposition: A | Discharge status: Home / Self Care | Payer: 59 | Source: Ambulatory Visit | Attending: Obstetrics and Gynecology | Admitting: Obstetrics and Gynecology

## 2016-05-29 ENCOUNTER — Encounter (HOSPITAL_COMMUNITY): Payer: Self-pay

## 2016-05-29 DIAGNOSIS — G43909 Migraine, unspecified, not intractable, without status migrainosus: Secondary | ICD-10-CM | POA: Diagnosis not present

## 2016-05-29 DIAGNOSIS — N803 Endometriosis of pelvic peritoneum: Secondary | ICD-10-CM | POA: Diagnosis not present

## 2016-05-29 DIAGNOSIS — I10 Essential (primary) hypertension: Secondary | ICD-10-CM | POA: Diagnosis not present

## 2016-05-29 DIAGNOSIS — N938 Other specified abnormal uterine and vaginal bleeding: Secondary | ICD-10-CM | POA: Diagnosis not present

## 2016-05-29 DIAGNOSIS — N888 Other specified noninflammatory disorders of cervix uteri: Secondary | ICD-10-CM | POA: Diagnosis not present

## 2016-05-29 DIAGNOSIS — N838 Other noninflammatory disorders of ovary, fallopian tube and broad ligament: Secondary | ICD-10-CM | POA: Diagnosis not present

## 2016-05-29 DIAGNOSIS — D251 Intramural leiomyoma of uterus: Secondary | ICD-10-CM | POA: Diagnosis not present

## 2016-05-29 DIAGNOSIS — Z7982 Long term (current) use of aspirin: Secondary | ICD-10-CM | POA: Diagnosis not present

## 2016-05-29 HISTORY — DX: Cardiac murmur, unspecified: R01.1

## 2016-05-29 LAB — CBC
HCT: 37.6 % (ref 36.0–46.0)
Hemoglobin: 13.5 g/dL (ref 12.0–15.0)
MCH: 27.6 pg (ref 26.0–34.0)
MCHC: 35.9 g/dL (ref 30.0–36.0)
MCV: 76.9 fL — ABNORMAL LOW (ref 78.0–100.0)
PLATELETS: 308 10*3/uL (ref 150–400)
RBC: 4.89 MIL/uL (ref 3.87–5.11)
RDW: 14.4 % (ref 11.5–15.5)
WBC: 5.2 10*3/uL (ref 4.0–10.5)

## 2016-05-29 LAB — BASIC METABOLIC PANEL
Anion gap: 7 (ref 5–15)
BUN: 10 mg/dL (ref 6–20)
CALCIUM: 9.6 mg/dL (ref 8.9–10.3)
CHLORIDE: 108 mmol/L (ref 101–111)
CO2: 23 mmol/L (ref 22–32)
CREATININE: 0.7 mg/dL (ref 0.44–1.00)
Glucose, Bld: 85 mg/dL (ref 65–99)
Potassium: 4.5 mmol/L (ref 3.5–5.1)
SODIUM: 138 mmol/L (ref 135–145)

## 2016-05-31 ENCOUNTER — Ambulatory Visit (HOSPITAL_COMMUNITY)
Admission: EM | Admit: 2016-05-31 | Discharge: 2016-05-31 | Disposition: A | Discharge status: Home / Self Care | Payer: Worker's Compensation | Attending: Emergency Medicine | Admitting: Emergency Medicine

## 2016-05-31 ENCOUNTER — Encounter (HOSPITAL_COMMUNITY): Payer: Self-pay | Admitting: Anesthesiology

## 2016-05-31 ENCOUNTER — Encounter (HOSPITAL_COMMUNITY): Payer: Self-pay | Admitting: *Deleted

## 2016-05-31 DIAGNOSIS — M545 Low back pain, unspecified: Secondary | ICD-10-CM

## 2016-05-31 DIAGNOSIS — W19XXXA Unspecified fall, initial encounter: Secondary | ICD-10-CM

## 2016-05-31 DIAGNOSIS — S7001XA Contusion of right hip, initial encounter: Secondary | ICD-10-CM | POA: Diagnosis not present

## 2016-05-31 DIAGNOSIS — S7011XA Contusion of right thigh, initial encounter: Secondary | ICD-10-CM

## 2016-05-31 DIAGNOSIS — S40021A Contusion of right upper arm, initial encounter: Secondary | ICD-10-CM | POA: Diagnosis not present

## 2016-05-31 MED ORDER — NAPROXEN 375 MG PO TABS: 375.0000 mg | ORAL_TABLET | Freq: Two times a day (BID) | ORAL | Status: DC

## 2016-05-31 MED ORDER — CEFAZOLIN SODIUM-DEXTROSE 2-4 GM/100ML-% IV SOLN
2.0000 g | INTRAVENOUS | Status: AC
  Administered 2016-06-01: 2 g via INTRAVENOUS

## 2016-05-31 NOTE — Discharge Instructions (Signed)
Contusion Ice to the sore areas for the next couple days. Then may use heat. Take Naprosyn for pain as needed. Perform stretches as demonstrated. A contusion is a deep bruise. Contusions happen when an injury causes bleeding under the skin. Symptoms of bruising include pain, swelling, and discolored skin. The skin may turn blue, purple, or yellow. HOME CARE   Rest the injured area.  If told, put ice on the injured area.  Put ice in a plastic bag.  Place a towel between your skin and the bag.  Leave the ice on for 20 minutes, 2-3 times per day.  If told, put light pressure (compression) on the injured area using an elastic bandage. Make sure the bandage is not too tight. Remove it and put it back on as told by your doctor.  If possible, raise (elevate) the injured area above the level of your heart while you are sitting or lying down.  Take over-the-counter and prescription medicines only as told by your doctor. GET HELP IF:  Your symptoms do not get better after several days of treatment.  Your symptoms get worse.  You have trouble moving the injured area. GET HELP RIGHT AWAY IF:   You have very bad pain.  You have a loss of feeling (numbness) in a hand or foot.  Your hand or foot turns pale or cold.   This information is not intended to replace advice given to you by your health care provider. Make sure you discuss any questions you have with your health care provider.   Document Released: 04/17/2008 Document Revised: 07/21/2015 Document Reviewed: 03/17/2015 Elsevier Interactive Patient Education 2016 Elsevier Inc.   Musculoskeletal Pain Musculoskeletal pain is muscle and boney aches and pains. These pains can occur in any part of the body. Your caregiver may treat you without knowing the cause of the pain. They may treat you if blood or urine tests, X-rays, and other tests were normal.  CAUSES There is often not a definite cause or reason for these pains. These pains  may be caused by a type of germ (virus). The discomfort may also come from overuse. Overuse includes working out too hard when your body is not fit. Boney aches also come from weather changes. Bone is sensitive to atmospheric pressure changes. HOME CARE INSTRUCTIONS   Ask when your test results will be ready. Make sure you get your test results.  Only take over-the-counter or prescription medicines for pain, discomfort, or fever as directed by your caregiver. If you were given medications for your condition, do not drive, operate machinery or power tools, or sign legal documents for 24 hours. Do not drink alcohol. Do not take sleeping pills or other medications that may interfere with treatment.  Continue all activities unless the activities cause more pain. When the pain lessens, slowly resume normal activities. Gradually increase the intensity and duration of the activities or exercise.  During periods of severe pain, bed rest may be helpful. Lay or sit in any position that is comfortable.  Putting ice on the injured area.  Put ice in a bag.  Place a towel between your skin and the bag.  Leave the ice on for 15 to 20 minutes, 3 to 4 times a day.  Follow up with your caregiver for continued problems and no reason can be found for the pain. If the pain becomes worse or does not go away, it may be necessary to repeat tests or do additional testing. Your caregiver may need  to look further for a possible cause. SEEK IMMEDIATE MEDICAL CARE IF:  You have pain that is getting worse and is not relieved by medications.  You develop chest pain that is associated with shortness or breath, sweating, feeling sick to your stomach (nauseous), or throw up (vomit).  Your pain becomes localized to the abdomen.  You develop any new symptoms that seem different or that concern you. MAKE SURE YOU:   Understand these instructions.  Will watch your condition.  Will get help right away if you are not  doing well or get worse.   This information is not intended to replace advice given to you by your health care provider. Make sure you discuss any questions you have with your health care provider.   Document Released: 10/30/2005 Document Revised: 01/22/2012 Document Reviewed: 07/04/2013 Elsevier Interactive Patient Education [employer].

## 2016-05-31 NOTE — ED Provider Notes (Signed)
CSN: [id number]     Arrival date & time 05/31/16  1731 History   First MD Initiated Contact with Patient 05/31/16 1810     Chief Complaint  Patient presents with  . Fall   (Consider location/radiation/quality/duration/timing/severity/associated sxs/prior Treatment) HPI Comments: 45 year old female states that she slipped and fell onto the ground yesterday. She is complaining of pain to the right lateral hip and proximal femur. She states it feels sore. Also complaining of soreness to the right arm. She feels achy in her right low back. She is ambulatory with full weightbearing. Denies injuring her head or neck. Denies spinal pain, left extremity pain, abdominal, chest or pelvic pain. She states she is scheduled to have a hysterectomy tomorrow via vaginal and laparoscopic routes.   Past Medical History  Diagnosis Date  . Hypertension   . Other and unspecified ovarian cysts   . Migraine   . Migraine without aura, with intractable migraine, so stated, without mention of status migrainosus 04/09/2014  . Heart murmur     childhood   Past Surgical History  Procedure Laterality Date  . Myomectomy    . Breast surgery    . Diagnostic laparoscopy      ectopic  . Dilation and curettage of uterus     Family History  Problem Relation Age of Onset  . Hypertension Mother   . Diabetes Mother   . Migraines Mother   . Hypertension Father   . Breast cancer Sister   . Hypertension Sister   . Migraines Sister    Social History  Substance Use Topics  . Smoking status: Never Smoker   . Smokeless tobacco: Never Used  . Alcohol Use: Yes     Comment: occ   OB History    No data available     Review of Systems  Constitutional: Positive for activity change. Negative for fever and fatigue.  HENT: Negative.   Respiratory: Negative.   Cardiovascular: Negative for chest pain and leg swelling.  Gastrointestinal: Negative.   Genitourinary: Negative.   Musculoskeletal: Positive for myalgias and  back pain. Negative for joint swelling, neck pain and neck stiffness.  Skin: Negative.   Neurological: Negative.  Negative for dizziness, tremors, syncope, facial asymmetry, speech difficulty and headaches.  Psychiatric/Behavioral: Negative.   All other systems reviewed and are negative.   Allergies  Contrast media; Levaquin; Other; and Tessalon  Home Medications   Prior to Admission medications   Medication Sig Start Date End Date Taking? Authorizing Provider  acetaminophen (TYLENOL) 325 MG tablet Take 650 mg by mouth every 6 (six) hours as needed (pain).    Historical Provider, MD  amLODipine (NORVASC) 10 MG tablet Take 10 mg by mouth daily.    Historical Provider, MD  aspirin 81 MG tablet Take 81 mg by mouth daily.    Historical Provider, MD  Biotin 5000 MCG CAPS Take 1 capsule by mouth daily.    Historical Provider, MD  butalbital-acetaminophen-caffeine (FIORICET) 50-325-40 MG tablet Take 0.5-1 tablets by mouth 2 (two) times daily as needed for headache or migraine.    Historical Provider, MD  diazepam (VALIUM) 2 MG tablet Take 2 mg by mouth at bedtime as needed (For sleep.).    Historical Provider, MD  ibuprofen (ADVIL,MOTRIN) 200 MG tablet Take 800 mg by mouth every 8 (eight) hours as needed for pain.    Historical Provider, MD  Magnesium 100 MG TABS Take 1 tablet by mouth daily.    Historical Provider, MD  naproxen (NAPROSYN) 375 MG  tablet Take 1 tablet (375 mg total) by mouth 2 (two) times daily. 05/31/16   Janne Napoleon, NP   Meds Ordered and Administered this Visit  Medications - No data to display  BP 139/90 mmHg  Pulse 70  Temp(Src) 97.5 F (36.4 C) (Oral)  Resp 18  Ht 5\' 1"  (1.549 m)  Wt 137 lb (62.143 kg)  BMI 25.90 kg/m2  SpO2 100%  LMP 05/31/2016 No data found.   Physical Exam  Constitutional: She is oriented to person, place, and time. She appears well-developed and well-nourished. No distress.  HENT:  Head: Normocephalic and atraumatic.  No injury to the  mouth or jaw. Able to open the jaw completely and demonstrate bilateral full movement. No intraoral swelling or injury.  Eyes: Conjunctivae and EOM are normal.  Neck: Normal range of motion. Neck supple. No tracheal deviation present.  No injury, pain, tenderness or swelling to the neck. Demonstrates full rapid range of motion without limitations.  Cardiovascular: Normal rate, regular rhythm and normal heart sounds.   Pulmonary/Chest: Effort normal and breath sounds normal.  Musculoskeletal: She exhibits no edema.  While standing the patient is able to maintain full weight to the right lower extremity and left lower extremity. When standing on the left lower extremity the patient is able to perform abduction, forward and posterior flexion with a hip. Examination while supine patient allows passive full range of motion with hip abduction, abduction, external and internal rotation without limitation. No pain or limitation of movement to the knee. There is tenderness to deep palpation of the right lateral hip and proximal most femur. Pelvic rocking does not produce pain. No pelvic tenderness.   Lymphadenopathy:    She has no cervical adenopathy.  Neurological: She is alert and oriented to person, place, and time. No cranial nerve deficit. She exhibits normal muscle tone.  Skin: Skin is warm and dry.  Psychiatric: She has a normal mood and affect.  Nursing note and vitals reviewed.   ED Course  Procedures (including critical care time)  Labs Review Labs Reviewed - No data to display  Imaging Review No results found.   Visual Acuity Review  Right Eye Distance:   Left Eye Distance:   Bilateral Distance:    Right Eye Near:   Left Eye Near:    Bilateral Near:         MDM   1. Fall, initial encounter   2. Contusion, hip and thigh, right, initial encounter   3. Arm contusion, right, initial encounter   4. Right-sided low back pain without sciatica    Extremity, back and neck  and right upper extremity with full range of motion. No deformities or evidence of bony injury. Patient is scheduled to have hysterectomy tomorrow. The neck, airway and jaw appear normal with function sufficient for airway control, full range of motion of the right hip should not prevent lithotomy positioning. No evidence of spinal or back injury. Contusion Ice to the sore areas for the next couple days. Then may use heat. Take Naprosyn for pain as needed. Perform stretches as demonstrated.    Janne Napoleon, NP 05/31/16 [phone]

## 2016-05-31 NOTE — Anesthesia Preprocedure Evaluation (Addendum)
Anesthesia Evaluation  Patient identified by MRN, date of birth, ID band Patient awake    Reviewed: Allergy & Precautions, H&P , NPO status , Patient's Chart, lab work & pertinent test results  Airway Mallampati: I  TM Distance: >3 FB Neck ROM: full    Dental no notable dental hx.    Pulmonary neg pulmonary ROS,    Pulmonary exam normal breath sounds clear to auscultation+ rhonchi        Cardiovascular Exercise Tolerance: Good hypertension, Pt. on medications Normal cardiovascular exam     Neuro/Psych negative psych ROS   GI/Hepatic negative GI ROS, Neg liver ROS,   Endo/Other  negative endocrine ROS  Renal/GU negative Renal ROS  negative genitourinary   Musculoskeletal   Abdominal Normal abdominal exam  (+)   Peds  Hematology negative hematology ROS (+)   Anesthesia Other Findings   Reproductive/Obstetrics negative OB ROS                             Anesthesia Physical Anesthesia Plan  ASA: II  Anesthesia Plan: General   Post-op Pain Management:    Induction: Intravenous  Airway Management Planned: Oral ETT  Additional Equipment:   Intra-op Plan:   Post-operative Plan: Extubation in OR  Informed Consent: I have reviewed the patients History and Physical, chart, labs and discussed the procedure including the risks, benefits and alternatives for the proposed anesthesia with the patient or authorized representative who has indicated his/her understanding and acceptance.   Dental Advisory Given  Plan Discussed with: CRNA and Surgeon  Anesthesia Plan Comments:         Anesthesia Quick Evaluation

## 2016-05-31 NOTE — ED Notes (Signed)
Pt  Reports    She     Reports     She   Fell  Over  A  speedbump  In  A  Parking  Garage      And  Reports pain in the  r  Hip        She  Also  Reports some pain    In the  r  Arm        she  Ambulated to  Room  With a  Steady  Fluid  Gait       She  Is  Able  To bear  Weight   But  Has  Some pain

## 2016-06-01 ENCOUNTER — Ambulatory Visit (HOSPITAL_COMMUNITY)
Admission: RE | Admit: 2016-06-01 | Discharge: 2016-06-02 | Disposition: A | Discharge status: Home / Self Care | Payer: 59 | Source: Ambulatory Visit | Attending: Obstetrics and Gynecology | Admitting: Obstetrics and Gynecology

## 2016-06-01 ENCOUNTER — Ambulatory Visit (HOSPITAL_COMMUNITY): Payer: 59 | Admitting: Anesthesiology

## 2016-06-01 ENCOUNTER — Encounter (HOSPITAL_COMMUNITY)
Admission: RE | Disposition: A | Discharge status: Home / Self Care | Payer: Self-pay | Source: Ambulatory Visit | Attending: Obstetrics and Gynecology

## 2016-06-01 ENCOUNTER — Encounter (HOSPITAL_COMMUNITY): Payer: Self-pay | Admitting: *Deleted

## 2016-06-01 DIAGNOSIS — N938 Other specified abnormal uterine and vaginal bleeding: Principal | ICD-10-CM | POA: Insufficient documentation

## 2016-06-01 DIAGNOSIS — N838 Other noninflammatory disorders of ovary, fallopian tube and broad ligament: Secondary | ICD-10-CM | POA: Insufficient documentation

## 2016-06-01 DIAGNOSIS — Z90710 Acquired absence of both cervix and uterus: Secondary | ICD-10-CM | POA: Diagnosis present

## 2016-06-01 DIAGNOSIS — Z7982 Long term (current) use of aspirin: Secondary | ICD-10-CM | POA: Insufficient documentation

## 2016-06-01 DIAGNOSIS — G43009 Migraine without aura, not intractable, without status migrainosus: Secondary | ICD-10-CM

## 2016-06-01 DIAGNOSIS — N888 Other specified noninflammatory disorders of cervix uteri: Secondary | ICD-10-CM | POA: Insufficient documentation

## 2016-06-01 DIAGNOSIS — N803 Endometriosis of pelvic peritoneum: Secondary | ICD-10-CM | POA: Insufficient documentation

## 2016-06-01 DIAGNOSIS — I10 Essential (primary) hypertension: Secondary | ICD-10-CM | POA: Insufficient documentation

## 2016-06-01 DIAGNOSIS — G43909 Migraine, unspecified, not intractable, without status migrainosus: Secondary | ICD-10-CM | POA: Insufficient documentation

## 2016-06-01 DIAGNOSIS — D251 Intramural leiomyoma of uterus: Secondary | ICD-10-CM | POA: Insufficient documentation

## 2016-06-01 HISTORY — PX: ROBOTIC ASSISTED TOTAL HYSTERECTOMY WITH SALPINGECTOMY: SHX6679

## 2016-06-01 SURGERY — ROBOTIC ASSISTED TOTAL HYSTERECTOMY WITH SALPINGECTOMY
Anesthesia: General | Site: Abdomen | Laterality: Bilateral

## 2016-06-01 MED ORDER — DEXTROSE IN LACTATED RINGERS 5 % IV SOLN
INTRAVENOUS | Status: DC
  Administered 2016-06-01: 16:00:00 via INTRAVENOUS

## 2016-06-01 MED ORDER — HYDROMORPHONE HCL 1 MG/ML IJ SOLN
0.2000 mg | INTRAMUSCULAR | Status: DC | PRN
  Administered 2016-06-01: 0.6 mg via INTRAVENOUS
  Filled 2016-06-01: qty 1

## 2016-06-01 MED ORDER — ONDANSETRON HCL 4 MG PO TABS: 4.0000 mg | ORAL_TABLET | Freq: Four times a day (QID) | ORAL | Status: DC | PRN

## 2016-06-01 MED ORDER — MENTHOL 3 MG MT LOZG: 1.0000 | LOZENGE | OROMUCOSAL | Status: DC | PRN

## 2016-06-01 MED ORDER — IBUPROFEN 800 MG PO TABS
800.0000 mg | ORAL_TABLET | Freq: Three times a day (TID) | ORAL | Status: DC | PRN
  Administered 2016-06-01 – 2016-06-02 (×2): 800 mg via ORAL
  Filled 2016-06-01 (×2): qty 1

## 2016-06-01 MED ORDER — AMLODIPINE BESYLATE 10 MG PO TABS
10.0000 mg | ORAL_TABLET | Freq: Every day | ORAL | Status: DC
  Administered 2016-06-01: 10 mg via ORAL
  Filled 2016-06-01 (×3): qty 1

## 2016-06-01 MED ORDER — SODIUM CHLORIDE 0.9 % IV SOLN
INTRAVENOUS | Status: DC | PRN
  Administered 2016-06-01: 60 mL

## 2016-06-01 MED ORDER — LIDOCAINE HCL (CARDIAC) 20 MG/ML IV SOLN
INTRAVENOUS | Status: DC | PRN
  Administered 2016-06-01: 60 mg via INTRAVENOUS

## 2016-06-01 MED ORDER — PROPOFOL 10 MG/ML IV BOLUS
INTRAVENOUS | Status: AC
  Filled 2016-06-01: qty 20

## 2016-06-01 MED ORDER — DEXAMETHASONE SODIUM PHOSPHATE 4 MG/ML IJ SOLN
INTRAMUSCULAR | Status: DC | PRN
  Administered 2016-06-01: 4 mg via INTRAVENOUS

## 2016-06-01 MED ORDER — ROCURONIUM BROMIDE 100 MG/10ML IV SOLN
INTRAVENOUS | Status: AC
  Filled 2016-06-01: qty 1

## 2016-06-01 MED ORDER — PANTOPRAZOLE SODIUM 40 MG PO TBEC
40.0000 mg | DELAYED_RELEASE_TABLET | Freq: Every day | ORAL | Status: DC
  Administered 2016-06-01 – 2016-06-02 (×2): 40 mg via ORAL
  Filled 2016-06-01 (×2): qty 1

## 2016-06-01 MED ORDER — BUPIVACAINE HCL (PF) 0.25 % IJ SOLN
INTRAMUSCULAR | Status: AC
  Filled 2016-06-01: qty 30

## 2016-06-01 MED ORDER — GLYCOPYRROLATE 0.2 MG/ML IJ SOLN
INTRAMUSCULAR | Status: AC
  Filled 2016-06-01: qty 2

## 2016-06-01 MED ORDER — LACTATED RINGERS IR SOLN
Status: DC | PRN
  Administered 2016-06-01: 3000 mL

## 2016-06-01 MED ORDER — SCOPOLAMINE 1 MG/3DAYS TD PT72
1.0000 | MEDICATED_PATCH | Freq: Once | TRANSDERMAL | Status: DC
  Administered 2016-06-01: 1.5 mg via TRANSDERMAL

## 2016-06-01 MED ORDER — ONDANSETRON HCL 4 MG/2ML IJ SOLN
4.0000 mg | Freq: Four times a day (QID) | INTRAMUSCULAR | Status: DC | PRN
  Administered 2016-06-01: 4 mg via INTRAVENOUS
  Filled 2016-06-01: qty 2

## 2016-06-01 MED ORDER — SIMETHICONE 80 MG PO CHEW
80.0000 mg | CHEWABLE_TABLET | Freq: Four times a day (QID) | ORAL | Status: DC | PRN
  Administered 2016-06-02: 80 mg via ORAL
  Filled 2016-06-01: qty 1

## 2016-06-01 MED ORDER — BUPIVACAINE HCL (PF) 0.25 % IJ SOLN
INTRAMUSCULAR | Status: DC | PRN
  Administered 2016-06-01: 9 mL

## 2016-06-01 MED ORDER — SODIUM CHLORIDE 0.9 % IJ SOLN
INTRAMUSCULAR | Status: AC
  Filled 2016-06-01: qty 50

## 2016-06-01 MED ORDER — LACTATED RINGERS IV SOLN
INTRAVENOUS | Status: DC
  Administered 2016-06-01: 09:00:00 via INTRAVENOUS
  Administered 2016-06-01: 1000 mL via INTRAVENOUS
  Administered 2016-06-01: 07:00:00 via INTRAVENOUS

## 2016-06-01 MED ORDER — ONDANSETRON HCL 4 MG/2ML IJ SOLN
INTRAMUSCULAR | Status: DC | PRN
  Administered 2016-06-01: 4 mg via INTRAVENOUS

## 2016-06-01 MED ORDER — FENTANYL CITRATE (PF) 250 MCG/5ML IJ SOLN
INTRAMUSCULAR | Status: AC
  Filled 2016-06-01: qty 5

## 2016-06-01 MED ORDER — DIAZEPAM 2 MG PO TABS: 2.0000 mg | ORAL_TABLET | Freq: Every evening | ORAL | Status: DC | PRN

## 2016-06-01 MED ORDER — MAGNESIUM 200 MG PO TABS
1.0000 | ORAL_TABLET | Freq: Every day | ORAL | Status: DC
  Administered 2016-06-01 – 2016-06-02 (×2): 200 mg via ORAL
  Filled 2016-06-01 (×3): qty 1

## 2016-06-01 MED ORDER — ONDANSETRON HCL 4 MG/2ML IJ SOLN
INTRAMUSCULAR | Status: AC
  Filled 2016-06-01: qty 2

## 2016-06-01 MED ORDER — FENTANYL CITRATE (PF) 100 MCG/2ML IJ SOLN
INTRAMUSCULAR | Status: DC | PRN
  Administered 2016-06-01 (×2): 100 ug via INTRAVENOUS
  Administered 2016-06-01: 50 ug via INTRAVENOUS

## 2016-06-01 MED ORDER — ROPIVACAINE HCL 5 MG/ML IJ SOLN
INTRAMUSCULAR | Status: AC
  Filled 2016-06-01: qty 30

## 2016-06-01 MED ORDER — BUTALBITAL-APAP-CAFFEINE 50-325-40 MG PO TABS: 0.5000 | ORAL_TABLET | Freq: Two times a day (BID) | ORAL | Status: DC | PRN

## 2016-06-01 MED ORDER — SCOPOLAMINE 1 MG/3DAYS TD PT72
MEDICATED_PATCH | TRANSDERMAL | Status: AC
  Administered 2016-06-01: 1.5 mg via TRANSDERMAL
  Filled 2016-06-01: qty 1

## 2016-06-01 MED ORDER — KETOROLAC TROMETHAMINE 30 MG/ML IJ SOLN
INTRAMUSCULAR | Status: DC | PRN
  Administered 2016-06-01: 30 mg via INTRAVENOUS

## 2016-06-01 MED ORDER — SUGAMMADEX SODIUM 200 MG/2ML IV SOLN
INTRAVENOUS | Status: DC | PRN
  Administered 2016-06-01: 200 mg via INTRAVENOUS

## 2016-06-01 MED ORDER — DEXAMETHASONE SODIUM PHOSPHATE 4 MG/ML IJ SOLN
INTRAMUSCULAR | Status: AC
  Filled 2016-06-01: qty 1

## 2016-06-01 MED ORDER — KETOROLAC TROMETHAMINE 30 MG/ML IJ SOLN: 30.0000 mg | Freq: Four times a day (QID) | INTRAMUSCULAR | Status: DC

## 2016-06-01 MED ORDER — KETOROLAC TROMETHAMINE 30 MG/ML IJ SOLN
30.0000 mg | Freq: Four times a day (QID) | INTRAMUSCULAR | Status: DC
  Administered 2016-06-01: 30 mg via INTRAVENOUS
  Filled 2016-06-01 (×2): qty 1

## 2016-06-01 MED ORDER — MEPERIDINE HCL 25 MG/ML IJ SOLN: 6.2500 mg | INTRAMUSCULAR | Status: DC | PRN

## 2016-06-01 MED ORDER — KETOROLAC TROMETHAMINE 30 MG/ML IJ SOLN
30.0000 mg | Freq: Once | INTRAMUSCULAR | Status: DC
Start: 2016-06-01 — End: 2016-06-01

## 2016-06-01 MED ORDER — NEOSTIGMINE METHYLSULFATE 10 MG/10ML IV SOLN
INTRAVENOUS | Status: AC
  Filled 2016-06-01: qty 1

## 2016-06-01 MED ORDER — MIDAZOLAM HCL 2 MG/2ML IJ SOLN
INTRAMUSCULAR | Status: DC | PRN
  Administered 2016-06-01: 2 mg via INTRAVENOUS

## 2016-06-01 MED ORDER — ROCURONIUM BROMIDE 100 MG/10ML IV SOLN
INTRAVENOUS | Status: DC | PRN
  Administered 2016-06-01: 10 mg via INTRAVENOUS
  Administered 2016-06-01: 50 mg via INTRAVENOUS

## 2016-06-01 MED ORDER — SUGAMMADEX SODIUM 200 MG/2ML IV SOLN
INTRAVENOUS | Status: AC
  Filled 2016-06-01: qty 2

## 2016-06-01 MED ORDER — LIDOCAINE HCL (CARDIAC) 20 MG/ML IV SOLN
INTRAVENOUS | Status: AC
  Filled 2016-06-01: qty 5

## 2016-06-01 MED ORDER — HYDROMORPHONE HCL 1 MG/ML IJ SOLN: 0.2500 mg | INTRAMUSCULAR | Status: DC | PRN

## 2016-06-01 MED ORDER — PROPOFOL 10 MG/ML IV BOLUS
INTRAVENOUS | Status: DC | PRN
  Administered 2016-06-01: 200 mg via INTRAVENOUS

## 2016-06-01 MED ORDER — MIDAZOLAM HCL 2 MG/2ML IJ SOLN
INTRAMUSCULAR | Status: AC
  Filled 2016-06-01: qty 2

## 2016-06-01 MED ORDER — OXYCODONE-ACETAMINOPHEN 5-325 MG PO TABS
1.0000 | ORAL_TABLET | ORAL | Status: DC | PRN
Start: 2016-06-01 — End: 2016-06-02
  Administered 2016-06-01 – 2016-06-02 (×3): 1 via ORAL
  Filled 2016-06-01 (×3): qty 1

## 2016-06-01 MED ORDER — PROMETHAZINE HCL 25 MG/ML IJ SOLN: 6.2500 mg | INTRAMUSCULAR | Status: DC | PRN

## 2016-06-01 SURGICAL SUPPLY — 51 items
APPLICATOR ARISTA FLEXITIP XL (MISCELLANEOUS) ×3 IMPLANT
BARRIER ADHS 3X4 INTERCEED (GAUZE/BANDAGES/DRESSINGS) IMPLANT
CATH FOLEY 3WAY  5CC 16FR (CATHETERS) ×2
CATH FOLEY 3WAY 5CC 16FR (CATHETERS) ×1 IMPLANT
CLOTH BEACON ORANGE TIMEOUT ST (SAFETY) ×3 IMPLANT
CONT PATH 16OZ SNAP LID 3702 (MISCELLANEOUS) ×3 IMPLANT
COVER BACK TABLE 60X90IN (DRAPES) ×6 IMPLANT
COVER TIP SHEARS 8 DVNC (MISCELLANEOUS) ×1 IMPLANT
COVER TIP SHEARS 8MM DA VINCI (MISCELLANEOUS) ×2
DECANTER SPIKE VIAL GLASS SM (MISCELLANEOUS) ×9 IMPLANT
DURAPREP 26ML APPLICATOR (WOUND CARE) ×3 IMPLANT
ELECT REM PT RETURN 9FT ADLT (ELECTROSURGICAL) ×3
ELECTRODE REM PT RTRN 9FT ADLT (ELECTROSURGICAL) ×1 IMPLANT
GAUZE VASELINE 3X9 (GAUZE/BANDAGES/DRESSINGS) IMPLANT
GLOVE BIOGEL PI IND STRL 7.0 (GLOVE) ×5 IMPLANT
GLOVE BIOGEL PI INDICATOR 7.0 (GLOVE) ×10
GLOVE ECLIPSE 6.5 STRL STRAW (GLOVE) ×3 IMPLANT
HEMOSTAT ARISTA ABSORB 3G PWDR (MISCELLANEOUS) ×3 IMPLANT
KIT ACCESSORY DA VINCI DISP (KITS) ×2
KIT ACCESSORY DVNC DISP (KITS) ×1 IMPLANT
LEGGING LITHOTOMY PAIR STRL (DRAPES) ×3 IMPLANT
LIQUID BAND (GAUZE/BANDAGES/DRESSINGS) ×3 IMPLANT
NEEDLE INSUFFLATION 150MM (ENDOMECHANICALS) ×3 IMPLANT
OCCLUDER COLPOPNEUMO (BALLOONS) ×3 IMPLANT
PACK ROBOT WH (CUSTOM PROCEDURE TRAY) ×3 IMPLANT
PACK ROBOTIC GOWN (GOWN DISPOSABLE) ×3 IMPLANT
PAD PREP 24X48 CUFFED NSTRL (MISCELLANEOUS) ×6 IMPLANT
PAD TRENDELENBURG POSITION (MISCELLANEOUS) ×3 IMPLANT
SCISSORS LAP 5X35 DISP (ENDOMECHANICALS) IMPLANT
SET CYSTO W/LG BORE CLAMP LF (SET/KITS/TRAYS/PACK) IMPLANT
SET IRRIG TUBING LAPAROSCOPIC (IRRIGATION / IRRIGATOR) ×3 IMPLANT
SET TRI-LUMEN FLTR TB AIRSEAL (TUBING) ×3 IMPLANT
SLEEVE XCEL OPT CAN 5 100 (ENDOMECHANICALS) ×3 IMPLANT
SUT VIC AB 0 CT1 36 (SUTURE) ×9 IMPLANT
SUT VICRYL 0 UR6 27IN ABS (SUTURE) ×6 IMPLANT
SUT VICRYL 4-0 PS2 18IN ABS (SUTURE) ×12 IMPLANT
SUT VLOC 180 0 9IN  GS21 (SUTURE) ×2
SUT VLOC 180 0 9IN GS21 (SUTURE) ×1 IMPLANT
SYR 50ML LL SCALE MARK (SYRINGE) ×3 IMPLANT
SYRINGE 10CC LL (SYRINGE) ×3 IMPLANT
TIP RUMI ORANGE 6.7MMX12CM (TIP) IMPLANT
TIP UTERINE 5.1X6CM LAV DISP (MISCELLANEOUS) IMPLANT
TIP UTERINE 6.7X10CM GRN DISP (MISCELLANEOUS) IMPLANT
TIP UTERINE 6.7X6CM WHT DISP (MISCELLANEOUS) IMPLANT
TIP UTERINE 6.7X8CM BLUE DISP (MISCELLANEOUS) ×3 IMPLANT
TOWEL OR 17X24 6PK STRL BLUE (TOWEL DISPOSABLE) ×9 IMPLANT
TROCAR DISP BLADELESS 8 DVNC (TROCAR) ×1 IMPLANT
TROCAR DISP BLADELESS 8MM (TROCAR) ×2
TROCAR PORT AIRSEAL 8X120 (TROCAR) ×3 IMPLANT
TROCAR Z-THREAD 12X150 (TROCAR) ×3 IMPLANT
WATER STERILE IRR 1000ML POUR (IV SOLUTION) ×3 IMPLANT

## 2016-06-01 NOTE — Addendum Note (Signed)
Addendum  created 06/01/16 1432 by Riki Sheer, CRNA   Modules edited: Clinical Notes   Clinical Notes:  File: [id number]

## 2016-06-01 NOTE — Brief Op Note (Signed)
06/01/2016  10:24 AM  PATIENT:  Kelli Tran  45 y.o. female  PRE-OPERATIVE DIAGNOSIS:  Dysfunctional Uterine Bleeding, Uterine Fibroids, History of Pelvic Endometriosis  POST-OPERATIVE DIAGNOSIS:  Uterine Fibroids, History of Pelvic Endometriosis, DUB  PROCEDURE:  Procedure(s): ROBOTIC ASSISTED TOTAL HYSTERECTOMY WITH BILATERAL SALPINGECTOMY (Bilateral)  SURGEON:  Surgeon(s) and Role:    * Servando Salina, MD - Primary  PHYSICIAN ASSISTANT:   ASSISTANTS: Laury Deep, CNM    ANESTHESIA:   general  EBL:  Total I/O In: 1700 [I.V.:1700] Out: 290 [Urine:250; Blood:40]  BLOOD ADMINISTERED:none  DRAINS: none   LOCAL MEDICATIONS USED:  MARCAINE    and OTHER ropivocaine  SPECIMEN:  Source of Specimen:  uterus with cervix, tubes  DISPOSITION OF SPECIMEN:  PATHOLOGY  COUNTS:  YES  TOURNIQUET:  * No tourniquets in log *  DICTATION: .Other Dictation: Dictation Number [id number]  PLAN OF CARE: Admit for overnight observation  PATIENT DISPOSITION:  PACU - hemodynamically stable.   Delay start of Pharmacological VTE agent (>24hrs) due to surgical blood loss or risk of bleeding: no

## 2016-06-01 NOTE — Anesthesia Procedure Notes (Signed)
Procedure Name: Intubation Date/Time: 06/01/2016 7:37 AM Performed by: Elenore Paddy Pre-anesthesia Checklist: Patient identified, Emergency Drugs available, Suction available, Patient being monitored and Timeout performed Patient Re-evaluated:Patient Re-evaluated prior to inductionOxygen Delivery Method: Circle system utilized Preoxygenation: Pre-oxygenation with 100% oxygen Intubation Type: IV induction Ventilation: Mask ventilation without difficulty Laryngoscope Size: Mac and 3 Grade View: Grade II Tube type: Oral Tube size: 7.0 mm Number of attempts: 1 Airway Equipment and Method: Stylet Placement Confirmation: ETT inserted through vocal cords under direct vision,  positive ETCO2 and breath sounds checked- equal and bilateral Secured at: 20 cm Tube secured with: Tape Dental Injury: Teeth and Oropharynx as per pre-operative assessment

## 2016-06-01 NOTE — Anesthesia Postprocedure Evaluation (Signed)
Anesthesia Post Note  Patient: Kelli Tran  Procedure(s) Performed: Procedure(s) (LRB): ROBOTIC ASSISTED TOTAL HYSTERECTOMY WITH BILATERAL SALPINGECTOMY (Bilateral)  Patient location during evaluation: [facility] Anesthesia Type: General Level of consciousness: awake and alert Pain management: pain level controlled Vital Signs Assessment: post-procedure vital signs reviewed and stable Respiratory status: spontaneous breathing, nonlabored ventilation, respiratory function stable and patient connected to nasal cannula oxygen Cardiovascular status: blood pressure returned to baseline and stable Postop Assessment: no signs of nausea or vomiting Anesthetic complications: no     Last Vitals:  Filed Vitals:   06/01/16 1300 06/01/16 1310  BP: 114/79 117/76  Pulse: 80 86  Temp:  37.1 C  Resp: 17 20    Last Pain:  Filed Vitals:   06/01/16 1316  PainSc: 8    Pain Goal: Patients Stated Pain Goal: 4 (06/01/16 1310)               Riki Sheer

## 2016-06-01 NOTE — Anesthesia Postprocedure Evaluation (Signed)
Anesthesia Post Note  Patient: Chan Raczka Gorrell-Free  Procedure(s) Performed: Procedure(s) (LRB): ROBOTIC ASSISTED TOTAL HYSTERECTOMY WITH BILATERAL SALPINGECTOMY (Bilateral)  Patient location during evaluation: PACU Anesthesia Type: General Level of consciousness: awake Pain management: pain level controlled Vital Signs Assessment: post-procedure vital signs reviewed and stable Respiratory status: spontaneous breathing Cardiovascular status: stable Postop Assessment: no signs of nausea or vomiting Anesthetic complications: no     Last Vitals:  Filed Vitals:   06/01/16 1024 06/01/16 1030  BP: 115/84 115/80  Pulse: 85 68  Temp: 36.7 C   Resp: 43 15    Last Pain:  Filed Vitals:   06/01/16 1042  PainSc: Asleep   Pain Goal: Patients Stated Pain Goal: 4 (06/01/16 1030)               [place]

## 2016-06-01 NOTE — Transfer of Care (Signed)
Immediate Anesthesia Transfer of Care Note  Patient: Kelli Tran  Procedure(s) Performed: Procedure(s): ROBOTIC ASSISTED TOTAL HYSTERECTOMY WITH BILATERAL SALPINGECTOMY (Bilateral)  Patient Location: PACU  Anesthesia Type:General  Level of Consciousness: awake, alert  and oriented  Airway & Oxygen Therapy: Patient Spontanous Breathing and Patient connected to nasal cannula oxygen  Post-op Assessment: Report given to RN and Post -op Vital signs reviewed and stable  Post vital signs: Reviewed and stable  Last Vitals:  Filed Vitals:   06/01/16 0647  BP: 115/88  Pulse: 81  Temp: 36.7 C  Resp: 16    Last Pain: There were no vitals filed for this visit.    Patients Stated Pain Goal: 4 ([id number])  Complications: No apparent anesthesia complications

## 2016-06-02 DIAGNOSIS — N938 Other specified abnormal uterine and vaginal bleeding: Secondary | ICD-10-CM | POA: Diagnosis not present

## 2016-06-02 LAB — CBC
HCT: 32 % — ABNORMAL LOW (ref 36.0–46.0)
HEMOGLOBIN: 11.4 g/dL — AB (ref 12.0–15.0)
MCH: 27.3 pg (ref 26.0–34.0)
MCHC: 35.6 g/dL (ref 30.0–36.0)
MCV: 76.7 fL — ABNORMAL LOW (ref 78.0–100.0)
Platelets: 263 10*3/uL (ref 150–400)
RBC: 4.17 MIL/uL (ref 3.87–5.11)
RDW: 14.4 % (ref 11.5–15.5)
WBC: 9.9 10*3/uL (ref 4.0–10.5)

## 2016-06-02 LAB — BASIC METABOLIC PANEL
ANION GAP: 4 — AB (ref 5–15)
BUN: 7 mg/dL (ref 6–20)
CALCIUM: 8.1 mg/dL — AB (ref 8.9–10.3)
CHLORIDE: 109 mmol/L (ref 101–111)
CO2: 24 mmol/L (ref 22–32)
CREATININE: 0.58 mg/dL (ref 0.44–1.00)
GFR calc non Af Amer: >60 mL/min (ref >60)
Glucose, Bld: 107 mg/dL — ABNORMAL HIGH (ref 65–99)
Potassium: 3.7 mmol/L (ref 3.5–5.1)
SODIUM: 137 mmol/L (ref 135–145)

## 2016-06-02 MED ORDER — TRAMADOL HCL 50 MG PO TABS: 50.0000 mg | ORAL_TABLET | Freq: Four times a day (QID) | ORAL | Status: DC | PRN

## 2016-06-02 MED ORDER — SIMETHICONE 80 MG PO CHEW: 80.0000 mg | CHEWABLE_TABLET | Freq: Four times a day (QID) | ORAL | Status: DC | PRN

## 2016-06-02 MED ORDER — DIPHENHYDRAMINE HCL 25 MG PO CAPS
25.0000 mg | ORAL_CAPSULE | Freq: Four times a day (QID) | ORAL | Status: DC | PRN
  Administered 2016-06-02: 25 mg via ORAL
  Filled 2016-06-02: qty 1

## 2016-06-02 MED ORDER — HYDROMORPHONE HCL 4 MG PO TABS: 4.0000 mg | ORAL_TABLET | ORAL | Status: DC | PRN

## 2016-06-02 MED ORDER — HYDROMORPHONE HCL 2 MG PO TABS
4.0000 mg | ORAL_TABLET | ORAL | Status: DC | PRN
  Administered 2016-06-02 (×3): 4 mg via ORAL
  Filled 2016-06-02 (×3): qty 2

## 2016-06-02 MED ORDER — IBUPROFEN 800 MG PO TABS: 800.0000 mg | ORAL_TABLET | Freq: Three times a day (TID) | ORAL | Status: DC | PRN

## 2016-06-02 NOTE — Op Note (Signed)
NAMEMarland Kitchen  Kelli Tran, Kelli Tran         ACCOUNT NO.:  [id number]  MEDICAL RECORD NO.:  [id number]  LOCATION:  [id number]                          FACILITY:  [facility]  PHYSICIAN:  Servando Salina, M.D.DATE OF BIRTH:  1971-10-24  DATE OF PROCEDURE:  06/01/2016 DATE OF DISCHARGE:                              OPERATIVE REPORT   PREOPERATIVE DIAGNOSES:  Dysfunctional uterine bleeding, uterine fibroids, history of pelvic endometriosis.  PROCEDURE:  DaVinci robotic total hysterectomy with bilateral salpingectomy.  POSTOPERATIVE DIAGNOSES:  Fibroid uterus, dysfunctional uterine bleeding.  ANESTHESIA:  General.  SURGEON:  Servando Salina, MD  ASSISTANT:  Laury Deep, CNM  DESCRIPTION OF PROCEDURE:  Under adequate general anesthesia, the patient was placed in dorsal lithotomy position.  She was positioned for robotic surgery.  The patient was sterilely prepped and draped in usual fashion.  A 3-way indwelling Foley catheter was sterilely placed. Examination under anesthesia revealed a retroverted irregular uterus. No adnexal masses could be appreciated.  A weighted speculum was placed in the vagina.  Sims retractor was placed anteriorly.  The cervix which was parous viewed, 0 Vicryl figure-of-eight suture was placed on the anterior and on the posterior lip of the cervix.  The uterus sounded to 10 cm.  A #10 uterine manipulator with a medium size cup was introduced into the uterine cavity.  However, it was then noted that it was not fitting well at the cervicovaginal junction and then #8 uterine manipulator was introduced with good placement and at that point, the retractors were removed.  Attention was then turned to the abdomen. 0.25% Marcaine was injected at the supraumbilical site.  A small incision was then made.  Veress needle was introduced, tested with normal saline.  2 L of CO2 was subsequently insufflated.  Veress needle was then removed.  A 12 mm disposable trocar with sleeve was  introduced into the abdomen without incident.  The robotic camera was then placed with good evidence of entry into the abdomen without incident. Panoramic inspection was notable for right-sided mid abdomen adhesion, normal liver edge.  The patient was placed in Trendelenburg position. It was then noted that she had a fibroid uterus and elongated tubes, ovaries noted to be normal.  Additional port sites were then placed, 2 on the left 8 cm apart with two 8 mm robotic port site placed under direct visualization.  A right 8 mm robotic port site was also placed and in the right lower quadrant, #8 AirSeal was then placed.  The robot was docked to the patient's left side, and in arm #1 monopolar scissors, arm #2 PK dissector, and arm #3, the ProGrasp placed.  I then went to the surgical console. At the surgical console, inspection of the pelvis was then done.  Both ureters were noted to be peristalsing well deep in the pelvis, particularly on the left side.  The procedure was begun with the left fallopian tube which was proximally grasped with a PK dissector, cauterized, the mesosalpinx was serially clamped, cauterized, and cut with removal of that too.  The posterior cul-de-sac was without any evidence of endometriosis.  The retroperitoneal space was opened on the left.  A window was placed through the broad ligament and the left utero- ovarian ligament  was serially clamped, cauterized, and then cut.  This was then followed by the round ligament which was serially clamped, cauterized, and cut.  The anterior leaf of the broad ligament was then opened and the uterine vessels were identified.  The vesicouterine peritoneum was opened transversely anteriorly and displaced inferiorly with sharp dissection with skeletonization and visualization of very tortuous left uterine vessels.  They were subsequently clamped, cauterized, and then cut.  The uterus was then manipulated to view the opposite side  where the same procedure was performed starting again with the fallopian tube which was then removed separately.  Opening of the retroperitoneal space on the right and round ligament being clamped, cauterized, and cut.  The vesicouterine peritoneum was then finished opening anteriorly.  The uterine vessels were isolated, clamped, cauterized, and then cut.  Incidentally noted was with displacing the bladder further inferiorly, opening window was noted already, visualization of the RUMI cup.  Once the vessels were secured bilaterally, the insufflated vaginal was placed and the upper part of the RUMI cup was noted that the incision was then made at that junction circumferentially and removal of the uterus was then accomplished.  The monopolar scissors and the PK were then replaced by the large mega needle suture driver as well as the long tip forceps.  The inspection of the vaginal cuff showed there were some bleeders which were then cauterized the areas of extension anteriorly where that opening of the window was noted and a separate 0 Vicryl figure-of-eight sutures were then utilized to close that extension.  The vaginal cuff was then closed with 0 V-Loc figure-of- eight suture with a running stitch coming across to overt so that incision with a 2 layer closure and then finishing the midline. Bleeding on each angle was noted which was secured using the V-Loc suture.  The abdomen was then irrigated and suctioned of debris.  The abdomen was partially deflated for any bleeding vessels.  At that point, good hemostasis was noted.  The abdomen was irrigated and suctioned.  The adnexa were expected and hemostasis accordingly.  Ureters were noted to be peristalsing well.  At that point, the robotic instruments were removed.  The robot was undocked.  I went back to the patient's bedside. Panoramic inspection was then done.  The ropivacaine was then placed in the pelvis as well as the Arista along the  vaginal cuff.  The robotic port sites were then removed under direct visualization.  The abdomen was deflated.  The AirSeal was removed.  The larger incisions were closed with a fascial stitch of 0 Vicryl and then the skin was approximated with 4-0 Vicryl subcuticular closures.  Digital palpation of the vaginal cuff showed good approximation.  Specimen was uterus with cervix and tubes, sent to Pathology.  ESTIMATED BLOOD LOSS:  40 ml.  URINE OUTPUT:  250 ml.  INTRAOPERATIVE FLUIDS:  1700 ml.  COUNTS:  Sponge and instrument counts x2 was correct.  COMPLICATION:  None.  The patient tolerated the procedure well, was transferred to recovery in stable condition.     Servando Salina, M.D.     [place]/MEDQ  D:  06/01/2016  T:  06/02/2016  Job:  [id number]

## 2016-06-02 NOTE — Progress Notes (Signed)
Pt d/c at 2030 transported to her car  By NT accompanied by her sister. All d/c instructions has been reviewed and pt verbalized understanding.

## 2016-06-02 NOTE — Discharge Instructions (Signed)
Call if temperature greater than equal to 100.4, nothing per vagina for 4-6 weeks or severe nausea vomiting, increased incisional pain , drainage or redness in the incision site, no straining with bowel movements, showers no bath °

## 2016-06-02 NOTE — Discharge Summary (Signed)
Physician Discharge Summary  Patient ID: Kelli Tran MRN: [id number] DOB/AGE: 1971/02/16 45 y.o.  Admit date: 06/01/2016 Discharge date: 06/02/2016  Admission Diagnoses: DUB, fibroid uterus, hx pelvic endometriosis  Discharge Diagnoses: DUB, fibroid uterus, hx pelvic endometriosis Active Problems:   S/P hysterectomy migraine  Discharged Condition: stable  Hospital Course: pt was admitted to [facility] where she underwent daVinci robotic total hysterectomy, bilateral salpingectomy. Uncomplicated postop course  Consults: None  Significant Diagnostic Studies: labs:  CBC    Component Value Date/Time   WBC 9.9 06/02/2016 0518   RBC 4.17 06/02/2016 0518   HGB 11.4* 06/02/2016 0518   HCT 32.0* 06/02/2016 0518   PLT 263 06/02/2016 0518   MCV 76.7* 06/02/2016 0518   MCH 27.3 06/02/2016 0518   MCHC 35.6 06/02/2016 0518   RDW 14.4 06/02/2016 0518   LYMPHSABS 1.3 10/17/2015 1430   MONOABS 1.0 10/17/2015 1430   EOSABS 0.1 10/17/2015 1430   BASOSABS 0.0 10/17/2015 1430    BMP Latest Ref Rng 06/02/2016 05/29/2016 10/17/2015  Glucose 65 - 99 mg/dL 107(H) 85 84  BUN 6 - 20 mg/dL 7 10 7   Creatinine 0.44 - 1.00 mg/dL 0.58 0.70 0.67  Sodium 135 - 145 mmol/L 137 138 136  Potassium 3.5 - 5.1 mmol/L 3.7 4.5 3.6  Chloride 101 - 111 mmol/L 109 108 103  CO2 22 - 32 mmol/L 24 23 23   Calcium 8.9 - 10.3 mg/dL 8.1(L) 9.6 8.9     Treatments: surgery: Da vinci robotic total hysterectomy, bilateral salpingectomy  Discharge Exam: Blood pressure 99/66, pulse 67, temperature 98.5 F (36.9 C), temperature source Oral, resp. rate 14, height 5\' 1"  (1.549 m), weight 137 lb (62.143 kg), SpO2 98 %. General appearance: alert, cooperative and no distress Back: no tenderness to percussion or palpation Resp: clear to auscultation bilaterally Cardio: regular rate and rhythm, S1, S2 normal, no murmur, click, rub or gallop GI: soft active BS nondistended. incisions c/d/i Pelvic: deferred Extremities: no  edema, redness or tenderness in the calves or thighs  Pad no blood Low back tender( pt reports dx of contusion s/p fall)  Disposition: 01-Home or Self Care  Discharge Instructions    Call MD for:  persistant nausea and vomiting    Complete by:  As directed      Call MD for:  redness, tenderness, or signs of infection (pain, swelling, redness, odor or green/yellow discharge around incision site)    Complete by:  As directed      Call MD for:  temperature >100.4    Complete by:  As directed      Diet - low sodium heart healthy    Complete by:  As directed      Discharge instructions    Complete by:  As directed   Call if temperature greater than equal to 100.4, nothing per vagina for 4-6 weeks or severe nausea vomiting, increased incisional pain , drainage or redness in the incision site, no straining with bowel movements, showers no bath     Discharge patient    Complete by:  As directed      Increase activity slowly    Complete by:  As directed             Medication List    STOP taking these medications        aspirin 81 MG tablet     naproxen 375 MG tablet  Commonly known as:  NAPROSYN      TAKE these medications  acetaminophen 325 MG tablet  Commonly known as:  TYLENOL  Take 650 mg by mouth every 6 (six) hours as needed (pain).     amLODipine 10 MG tablet  Commonly known as:  NORVASC  Take 10 mg by mouth daily.     Biotin 5000 MCG Caps  Take 1 capsule by mouth daily.     diazepam 2 MG tablet  Commonly known as:  VALIUM  Take 2 mg by mouth at bedtime as needed (For sleep.).     FIORICET 50-325-40 MG tablet  Generic drug:  butalbital-acetaminophen-caffeine  Take 0.5-1 tablets by mouth 2 (two) times daily as needed for headache or migraine.     HYDROmorphone 4 MG tablet  Commonly known as:  DILAUDID  Take 1 tablet (4 mg total) by mouth every 4 (four) hours as needed for severe pain (pain).     ibuprofen 800 MG tablet  Commonly known as:   ADVIL,MOTRIN  Take 1 tablet (800 mg total) by mouth every 8 (eight) hours as needed (mild pain).     Magnesium 100 MG Tabs  Take 1 tablet by mouth daily.     phenazopyridine 200 MG tablet  Commonly known as:  PYRIDIUM  Take 200 mg by mouth once.     simethicone 80 MG chewable tablet  Commonly known as:  MYLICON  Chew 1 tablet (80 mg total) by mouth 4 (four) times daily as needed for flatulence.     traMADol 50 MG tablet  Commonly known as:  ULTRAM  Take 1 tablet (50 mg total) by mouth every 6 (six) hours as needed for moderate pain (pain).           Follow-up Information    Follow up with Ruthell Feigenbaum A, MD In 2 weeks.   Specialty:  Obstetrics and Gynecology   Contact information:   [address] [place] [address] [phone]       Signed: Servando Salina A 06/02/2016, 1:59 PM

## 2016-06-02 NOTE — Progress Notes (Signed)
Pt verbalizes understanding of d/c instructions, medications, follow up appts, when to seek medical attention and belongings policy. All questions were answered to pts satisfaction. Pt was given a copy of d/c instructions and her prescriptions. Pt is currently in her room resting and waiting on her sister to pick her up later this afternoon when she gets off of work. Kelli Tran

## 2016-06-02 NOTE — Progress Notes (Signed)
Subjective: Patient reports tolerating PO, + flatus and no problems voiding  Notes low back pain. Pt had a fall prior to surgery and was dx with contusion on her low back.    Objective: I have reviewed patient's vital signs.  vital signs, intake and output and labs. Filed Vitals:   06/02/16 0533 06/02/16 1156  BP: 100/56 99/66  Pulse: 67   Temp: 98.5 F (36.9 C)   Resp: 14    I/O last 3 completed shifts: In: 2827.1 [I.V.:2827.1] Out: 2215 [Urine:2175; Blood:40]    Lab Results  Component Value Date   WBC 9.9 06/02/2016   HGB 11.4* 06/02/2016   HCT 32.0* 06/02/2016   MCV 76.7* 06/02/2016   PLT 263 06/02/2016   Lab Results  Component Value Date   CREATININE 0.58 06/02/2016    EXAM General: alert, cooperative and no distress Resp: clear to auscultation bilaterally Cardio: regular rate and rhythm, S1, S2 normal, no murmur, click, rub or gallop GI: incision: clean, dry and intact and soft active BS nondistended Extremities: edema none Vaginal Bleeding: none  Assessment: s/p Procedure(s): ROBOTIC ASSISTED TOTAL HYSTERECTOMY WITH BILATERAL SALPINGECTOMY: stable, progressing well and tolerating diet  Plan: Advance diet Encourage ambulation Discontinue IV fluids Discharge home  D/c instructions reviewed. F/u 2 weeks Script given   LOS: 1 day    Mikaiya Tramble A, MD 06/02/2016 1:59 PM    06/02/2016, 1:59 PM

## 2016-06-06 ENCOUNTER — Encounter (HOSPITAL_COMMUNITY): Payer: Self-pay | Admitting: Obstetrics and Gynecology

## 2016-12-28 ENCOUNTER — Encounter (HOSPITAL_COMMUNITY): Payer: Self-pay | Admitting: Family Medicine

## 2016-12-28 ENCOUNTER — Ambulatory Visit (HOSPITAL_COMMUNITY)
Admission: EM | Admit: 2016-12-28 | Discharge: 2016-12-28 | Disposition: A | Discharge status: Nursing Home (Includes ICF) | Payer: 59 | Source: Home / Self Care

## 2016-12-28 ENCOUNTER — Emergency Department (HOSPITAL_COMMUNITY): Payer: 59

## 2016-12-28 ENCOUNTER — Encounter (HOSPITAL_COMMUNITY): Payer: Self-pay

## 2016-12-28 ENCOUNTER — Observation Stay (HOSPITAL_COMMUNITY)
Admission: EM | Admit: 2016-12-28 | Discharge: 2016-12-31 | Disposition: A | Discharge status: Home / Self Care | Payer: 59 | Attending: Internal Medicine | Admitting: Internal Medicine

## 2016-12-28 DIAGNOSIS — R7989 Other specified abnormal findings of blood chemistry: Secondary | ICD-10-CM

## 2016-12-28 DIAGNOSIS — Z79899 Other long term (current) drug therapy: Secondary | ICD-10-CM | POA: Diagnosis not present

## 2016-12-28 DIAGNOSIS — G43909 Migraine, unspecified, not intractable, without status migrainosus: Secondary | ICD-10-CM | POA: Diagnosis not present

## 2016-12-28 DIAGNOSIS — R079 Chest pain, unspecified: Secondary | ICD-10-CM

## 2016-12-28 DIAGNOSIS — I10 Essential (primary) hypertension: Secondary | ICD-10-CM | POA: Insufficient documentation

## 2016-12-28 DIAGNOSIS — R748 Abnormal levels of other serum enzymes: Secondary | ICD-10-CM | POA: Diagnosis not present

## 2016-12-28 DIAGNOSIS — R609 Edema, unspecified: Secondary | ICD-10-CM

## 2016-12-28 DIAGNOSIS — Z90710 Acquired absence of both cervix and uterus: Secondary | ICD-10-CM | POA: Diagnosis present

## 2016-12-28 DIAGNOSIS — Z8679 Personal history of other diseases of the circulatory system: Secondary | ICD-10-CM

## 2016-12-28 DIAGNOSIS — G459 Transient cerebral ischemic attack, unspecified: Secondary | ICD-10-CM

## 2016-12-28 DIAGNOSIS — R778 Other specified abnormalities of plasma proteins: Secondary | ICD-10-CM | POA: Diagnosis present

## 2016-12-28 DIAGNOSIS — R072 Precordial pain: Secondary | ICD-10-CM | POA: Diagnosis not present

## 2016-12-28 DIAGNOSIS — H532 Diplopia: Secondary | ICD-10-CM

## 2016-12-28 LAB — BASIC METABOLIC PANEL
Anion gap: 16 — ABNORMAL HIGH (ref 5–15)
BUN: 8 mg/dL (ref 6–20)
CHLORIDE: 106 mmol/L (ref 101–111)
CO2: 21 mmol/L — ABNORMAL LOW (ref 22–32)
Calcium: 9.7 mg/dL (ref 8.9–10.3)
Creatinine, Ser: 0.71 mg/dL (ref 0.44–1.00)
GFR calc non Af Amer: >60 mL/min (ref >60)
Glucose, Bld: 92 mg/dL (ref 65–99)
POTASSIUM: 4.2 mmol/L (ref 3.5–5.1)
SODIUM: 143 mmol/L (ref 135–145)

## 2016-12-28 LAB — CBC
HEMATOCRIT: 41 % (ref 36.0–46.0)
Hemoglobin: 14.6 g/dL (ref 12.0–15.0)
MCH: 29.1 pg (ref 26.0–34.0)
MCHC: 35.6 g/dL (ref 30.0–36.0)
MCV: 81.7 fL (ref 78.0–100.0)
PLATELETS: 277 10*3/uL (ref 150–400)
RBC: 5.02 MIL/uL (ref 3.87–5.11)
RDW: 14.1 % (ref 11.5–15.5)
WBC: 8.7 10*3/uL (ref 4.0–10.5)

## 2016-12-28 LAB — D-DIMER, QUANTITATIVE: D-Dimer, Quant: 0.44 ug/mL-FEU (ref 0.00–0.50)

## 2016-12-28 LAB — TROPONIN I: Troponin I: 0.05 ng/mL (ref <0.03)

## 2016-12-28 LAB — I-STAT TROPONIN, ED: Troponin i, poc: 0 ng/mL (ref 0.00–0.08)

## 2016-12-28 MED ORDER — ASPIRIN 81 MG PO CHEW
324.0000 mg | CHEWABLE_TABLET | Freq: Once | ORAL | Status: AC
  Administered 2016-12-28: 324 mg via ORAL
  Filled 2016-12-28: qty 4

## 2016-12-28 MED ORDER — LORAZEPAM 2 MG/ML IJ SOLN
0.5000 mg | Freq: Once | INTRAMUSCULAR | Status: AC
  Administered 2016-12-28: 0.5 mg via INTRAVENOUS
  Filled 2016-12-28: qty 1

## 2016-12-28 MED ORDER — NITROGLYCERIN 0.4 MG SL SUBL
0.4000 mg | SUBLINGUAL_TABLET | SUBLINGUAL | Status: DC | PRN
  Administered 2016-12-28: 0.4 mg via SUBLINGUAL
  Filled 2016-12-28: qty 1

## 2016-12-28 MED ORDER — ONDANSETRON HCL 4 MG/2ML IJ SOLN
4.0000 mg | Freq: Once | INTRAMUSCULAR | Status: AC
  Administered 2016-12-28: 4 mg via INTRAVENOUS
  Filled 2016-12-28: qty 2

## 2016-12-28 NOTE — ED Triage Notes (Signed)
Pt endorses central chest pressure with radiation to neck and both arms with shob, nausea and dizziness. Pt sent here from [facility]. VSS.

## 2016-12-28 NOTE — ED Notes (Signed)
Per EDP, giving patient nitro and aspirin and EDP placing order for admission.

## 2016-12-28 NOTE — ED Notes (Signed)
EDP made aware of patient's troponin of 0.05.

## 2016-12-28 NOTE — ED Notes (Signed)
Spoke with Dr. Joseph Art and advised pt go to the ED for further eval due to abnormalities on EKG, recommend pt go down by ambulance but patient stated she wanted to go by POV. Pt sent with papers in hand and copy of EKG.

## 2016-12-28 NOTE — ED Provider Notes (Signed)
[facility] DEPT Provider Note   CSN: [id number] Arrival date & time: 12/28/16  1857     History   Chief Complaint Chief Complaint  Patient presents with  . Chest Pain    HPI Kelli Tran is a 46 y.o. female.  The history is provided by the patient. No language interpreter was used.  Chest Pain   This is a new problem. The current episode started yesterday. The problem occurs constantly. The problem has been gradually worsening. The pain is present in the substernal region. The pain radiates to the right arm and left arm. Associated symptoms include nausea and shortness of breath. Pertinent negatives include no abdominal pain. Risk factors include post-menopausal.  Her family medical history is significant for hypertension.  Procedure history is negative for cardiac catheterization and exercise treadmill test.  Pt describes pressure sensation  Past Medical History:  Diagnosis Date  . Heart murmur    childhood  . Hypertension   . Migraine   . Migraine without aura, with intractable migraine, so stated, without mention of status migrainosus 04/09/2014  . Other and unspecified ovarian cysts     Patient Active Problem List   Diagnosis Date Noted  . S/P hysterectomy 06/01/2016  . Migraine without aura, with intractable migraine, so stated, without mention of status migrainosus 04/09/2014    Past Surgical History:  Procedure Laterality Date  . BREAST SURGERY    . DIAGNOSTIC LAPAROSCOPY     ectopic  . DILATION AND CURETTAGE OF UTERUS    . MYOMECTOMY    . ROBOTIC ASSISTED TOTAL HYSTERECTOMY WITH SALPINGECTOMY Bilateral 06/01/2016   Procedure: ROBOTIC ASSISTED TOTAL HYSTERECTOMY WITH BILATERAL SALPINGECTOMY;  Surgeon: Servando Salina, MD;  Location: [facility];  Service: Gynecology;  Laterality: Bilateral;    OB History    No data available       Home Medications    Prior to Admission medications   Medication Sig Start Date End Date Taking? Authorizing  Provider  Biotin 5000 MCG CAPS Take 1 capsule by mouth daily.   Yes Historical Provider, MD  butalbital-acetaminophen-caffeine (FIORICET) 50-325-40 MG tablet Take 0.5-1 tablets by mouth 2 (two) times daily as needed for headache or migraine.   Yes Historical Provider, MD  topiramate (TOPAMAX) 25 MG tablet Take 25-50 mg by mouth See admin instructions. Takes 1 tab in am and 2 tabs in pm 08/28/16  Yes Historical Provider, MD    Family History Family History  Problem Relation Age of Onset  . Hypertension Mother   . Diabetes Mother   . Migraines Mother   . Hypertension Father   . Breast cancer Sister   . Hypertension Sister   . Migraines Sister     Social History Social History  Substance Use Topics  . Smoking status: Never Smoker  . Smokeless tobacco: Never Used  . Alcohol use Yes     Comment: occ     Allergies   Contrast media [iodinated diagnostic agents]; Gadolinium derivatives; Levaquin [levofloxacin in d5w]; Other; and Tessalon [benzonatate]   Review of Systems Review of Systems  Respiratory: Positive for shortness of breath.   Cardiovascular: Positive for chest pain.  Gastrointestinal: Positive for nausea. Negative for abdominal pain.  All other systems reviewed and are negative.    Physical Exam Updated Vital Signs BP (!) 141/115   Pulse 95   Temp 98.7 F (37.1 C) (Oral)   Resp 23   Ht 5\' 2"  (1.575 m)   Wt 55.8 kg   LMP 05/31/2016  SpO2 100%   BMI 22.50 kg/m   Physical Exam  Constitutional: She appears well-developed and well-nourished. No distress.  HENT:  Head: Normocephalic and atraumatic.  Right Ear: External ear normal.  Left Ear: External ear normal.  Mouth/Throat: Oropharynx is clear and moist.  Eyes: Conjunctivae are normal.  Neck: Neck supple.  Cardiovascular: Normal rate and regular rhythm.   No murmur heard. Pulmonary/Chest: Effort normal and breath sounds normal. No respiratory distress.  Abdominal: Soft. There is no tenderness.    Musculoskeletal: She exhibits no edema.  Neurological: She is alert.  Skin: Skin is warm and dry.  Psychiatric: She has a normal mood and affect.  Nursing note and vitals reviewed.    ED Treatments / Results  Labs (all labs ordered are listed, but only abnormal results are displayed) Labs Reviewed  BASIC METABOLIC PANEL - Abnormal; Notable for the following:       Result Value   CO2 21 (*)    Anion gap 16 (*)    All other components within normal limits  TROPONIN I - Abnormal; Notable for the following:    Troponin I 0.05 (*)    All other components within normal limits  CBC  D-DIMER, QUANTITATIVE (NOT AT [facility])  I-STAT TROPOININ, ED    EKG  EKG Interpretation  Date/Time:  Thursday December 28 2016 19:08:22 EST Ventricular Rate:  97 PR Interval:  132 QRS Duration: 80 QT Interval:  338 QTC Calculation: 429 R Axis:   69 Text Interpretation:  Normal sinus rhythm Nonspecific T wave abnormality Abnormal ECG Confirmed by Lita Mains  MD, DAVID ([id number]) on 12/28/2016 10:20:09 PM       Radiology Dg Chest 2 View  Result Date: 12/28/2016 CLINICAL DATA:  Chest pain and tightness x2 days. Bilateral arm pain. EXAM: CHEST  2 VIEW COMPARISON:  03/10/2013 CXR FINDINGS: The heart size and mediastinal contours are within normal limits. Both lungs are clear. The visualized skeletal structures are unremarkable. IMPRESSION: No active cardiopulmonary disease. Electronically Signed   By: Ashley Royalty M.D.   On: 12/28/2016 19:31    Procedures Procedures (including critical care time)  Medications Ordered in ED Medications  nitroGLYCERIN (NITROSTAT) SL tablet 0.4 mg (0.4 mg Sublingual Given 12/28/16 2217)  ondansetron (ZOFRAN) injection 4 mg (4 mg Intravenous Given 12/28/16 2200)  aspirin chewable tablet 324 mg (324 mg Oral Given 12/28/16 2216)  LORazepam (ATIVAN) injection 0.5 mg (0.5 mg Intravenous Given 12/28/16 2242)     Initial Impression / Assessment and Plan / ED Course  I have  reviewed the triage vital signs and the nursing notes.  Pertinent labs & imaging results that were available during my care of the patient were reviewed by me and considered in my medical decision making (see chart for details).     Pt given asa and nitroglyerin sublingual.  Ativan iv for anxiety.  Pt reports she feels better.  Pt still feels slight pressure in her chest.  Pt has elevation of 1st troponin..  Second troponin is 0.00.  ddimer is normal.  Dr. Lita Mains in to see and examine.   Final Clinical Impressions(s) / ED Diagnoses   Final diagnoses:  Chest pain, unspecified type    New Prescriptions New Prescriptions   No medications on file     Fransico Meadow, PA-C 12/29/16 0038    Julianne Rice, MD 01/02/17 1535

## 2016-12-28 NOTE — ED Triage Notes (Signed)
Pt here for central chest pain since yesterday that worsened today and nausea. sts she was recently weaned off of her BP meds due to low blood pressure. sts heaviness in arms also.

## 2016-12-29 ENCOUNTER — Observation Stay (HOSPITAL_BASED_OUTPATIENT_CLINIC_OR_DEPARTMENT_OTHER): Payer: 59

## 2016-12-29 ENCOUNTER — Encounter (HOSPITAL_COMMUNITY): Payer: Self-pay | Admitting: Internal Medicine

## 2016-12-29 DIAGNOSIS — R748 Abnormal levels of other serum enzymes: Secondary | ICD-10-CM

## 2016-12-29 DIAGNOSIS — Z90710 Acquired absence of both cervix and uterus: Secondary | ICD-10-CM | POA: Diagnosis not present

## 2016-12-29 DIAGNOSIS — R079 Chest pain, unspecified: Secondary | ICD-10-CM | POA: Diagnosis not present

## 2016-12-29 DIAGNOSIS — G43909 Migraine, unspecified, not intractable, without status migrainosus: Secondary | ICD-10-CM | POA: Diagnosis not present

## 2016-12-29 DIAGNOSIS — R778 Other specified abnormalities of plasma proteins: Secondary | ICD-10-CM | POA: Diagnosis present

## 2016-12-29 DIAGNOSIS — Z8679 Personal history of other diseases of the circulatory system: Secondary | ICD-10-CM | POA: Diagnosis not present

## 2016-12-29 DIAGNOSIS — I10 Essential (primary) hypertension: Secondary | ICD-10-CM | POA: Diagnosis not present

## 2016-12-29 DIAGNOSIS — R7989 Other specified abnormal findings of blood chemistry: Secondary | ICD-10-CM

## 2016-12-29 DIAGNOSIS — R072 Precordial pain: Secondary | ICD-10-CM | POA: Diagnosis not present

## 2016-12-29 LAB — CBC WITH DIFFERENTIAL/PLATELET
Basophils Absolute: 0 10*3/uL (ref 0.0–0.1)
Basophils Relative: 0 %
EOS ABS: 0 10*3/uL (ref 0.0–0.7)
EOS PCT: 0 %
HCT: 36.2 % (ref 36.0–46.0)
Hemoglobin: 12.8 g/dL (ref 12.0–15.0)
LYMPHS ABS: 1.2 10*3/uL (ref 0.7–4.0)
Lymphocytes Relative: 18 %
MCH: 28.6 pg (ref 26.0–34.0)
MCHC: 35.4 g/dL (ref 30.0–36.0)
MCV: 81 fL (ref 78.0–100.0)
Monocytes Absolute: 0.3 10*3/uL (ref 0.1–1.0)
Monocytes Relative: 5 %
Neutro Abs: 5.1 10*3/uL (ref 1.7–7.7)
Neutrophils Relative %: 77 %
PLATELETS: 251 10*3/uL (ref 150–400)
RBC: 4.47 MIL/uL (ref 3.87–5.11)
RDW: 13.7 % (ref 11.5–15.5)
WBC: 6.7 10*3/uL (ref 4.0–10.5)

## 2016-12-29 LAB — NM MYOCAR MULTI W/SPECT W/WALL MOTION / EF
CSEPEDS: 10 s
CSEPHR: 88 %
CSEPPHR: 157 {beats}/min
Estimated workload: 12 METS
Exercise duration (min): 10 min
MPHR: 177 {beats}/min
Rest HR: 93 {beats}/min

## 2016-12-29 LAB — BASIC METABOLIC PANEL
Anion gap: 8 (ref 5–15)
BUN: 6 mg/dL (ref 6–20)
CALCIUM: 8.8 mg/dL — AB (ref 8.9–10.3)
CHLORIDE: 108 mmol/L (ref 101–111)
CO2: 23 mmol/L (ref 22–32)
CREATININE: 0.65 mg/dL (ref 0.44–1.00)
GFR calc Af Amer: >60 mL/min (ref >60)
GFR calc non Af Amer: >60 mL/min (ref >60)
Glucose, Bld: 91 mg/dL (ref 65–99)
Potassium: 3.5 mmol/L (ref 3.5–5.1)
SODIUM: 139 mmol/L (ref 135–145)

## 2016-12-29 LAB — LIPID PANEL
CHOLESTEROL: 183 mg/dL (ref 0–200)
HDL: 57 mg/dL (ref >40)
LDL Cholesterol: 118 mg/dL — ABNORMAL HIGH (ref 0–99)
TRIGLYCERIDES: 38 mg/dL (ref <150)
Total CHOL/HDL Ratio: 3.2 RATIO
VLDL: 8 mg/dL (ref 0–40)

## 2016-12-29 LAB — TROPONIN I: Troponin I: <0.03 ng/mL (ref <0.03)

## 2016-12-29 LAB — RAPID URINE DRUG SCREEN, HOSP PERFORMED
Amphetamines: NOT DETECTED
BARBITURATES: NOT DETECTED
BENZODIAZEPINES: NOT DETECTED
COCAINE: NOT DETECTED
OPIATES: NOT DETECTED
Tetrahydrocannabinol: NOT DETECTED

## 2016-12-29 LAB — ECHOCARDIOGRAM COMPLETE
Height: 62 in
Weight: 1960 oz

## 2016-12-29 LAB — URINALYSIS, ROUTINE W REFLEX MICROSCOPIC
BILIRUBIN URINE: NEGATIVE
Glucose, UA: NEGATIVE mg/dL
Hgb urine dipstick: NEGATIVE
KETONES UR: NEGATIVE mg/dL
Leukocytes, UA: NEGATIVE
NITRITE: NEGATIVE
PROTEIN: NEGATIVE mg/dL
Specific Gravity, Urine: 1.009 (ref 1.005–1.030)
pH: 7 (ref 5.0–8.0)

## 2016-12-29 MED ORDER — ENSURE ENLIVE PO LIQD
237.0000 mL | Freq: Two times a day (BID) | ORAL | Status: DC
  Administered 2016-12-29 – 2016-12-31 (×5): 237 mL via ORAL

## 2016-12-29 MED ORDER — PANTOPRAZOLE SODIUM 40 MG PO TBEC
40.0000 mg | DELAYED_RELEASE_TABLET | Freq: Every day | ORAL | Status: DC
  Administered 2016-12-29 – 2016-12-31 (×3): 40 mg via ORAL
  Filled 2016-12-29 (×2): qty 1

## 2016-12-29 MED ORDER — ENOXAPARIN SODIUM 40 MG/0.4ML ~~LOC~~ SOLN
40.0000 mg | Freq: Every day | SUBCUTANEOUS | Status: DC
  Administered 2016-12-29 – 2016-12-31 (×3): 40 mg via SUBCUTANEOUS
  Filled 2016-12-29 (×3): qty 0.4

## 2016-12-29 MED ORDER — ACETAMINOPHEN 325 MG PO TABS: 650.0000 mg | ORAL_TABLET | ORAL | Status: DC | PRN

## 2016-12-29 MED ORDER — TECHNETIUM TC 99M TETROFOSMIN IV KIT
10.0000 | PACK | Freq: Once | INTRAVENOUS | Status: AC | PRN
  Administered 2016-12-29: 10 via INTRAVENOUS

## 2016-12-29 MED ORDER — TOPIRAMATE 100 MG PO TABS
50.0000 mg | ORAL_TABLET | Freq: Every day | ORAL | Status: DC
  Administered 2016-12-29 – 2016-12-30 (×2): 50 mg via ORAL
  Filled 2016-12-29 (×2): qty 1

## 2016-12-29 MED ORDER — KETOROLAC TROMETHAMINE 30 MG/ML IJ SOLN
30.0000 mg | Freq: Once | INTRAMUSCULAR | Status: AC
  Administered 2016-12-29: 30 mg via INTRAVENOUS
  Filled 2016-12-29: qty 1

## 2016-12-29 MED ORDER — ONDANSETRON HCL 4 MG/2ML IJ SOLN
4.0000 mg | Freq: Four times a day (QID) | INTRAMUSCULAR | Status: DC | PRN
  Administered 2016-12-29 (×2): 4 mg via INTRAVENOUS
  Filled 2016-12-29 (×2): qty 2

## 2016-12-29 MED ORDER — ASPIRIN EC 81 MG PO TBEC
81.0000 mg | DELAYED_RELEASE_TABLET | Freq: Every day | ORAL | Status: DC
  Administered 2016-12-29 – 2016-12-31 (×3): 81 mg via ORAL
  Filled 2016-12-29 (×3): qty 1

## 2016-12-29 MED ORDER — TOPIRAMATE 25 MG PO TABS
25.0000 mg | ORAL_TABLET | Freq: Every day | ORAL | Status: DC
  Administered 2016-12-29 – 2016-12-31 (×3): 25 mg via ORAL
  Filled 2016-12-29 (×3): qty 1

## 2016-12-29 MED ORDER — TOPIRAMATE 25 MG PO TABS: 25.0000 mg | ORAL_TABLET | ORAL | Status: DC

## 2016-12-29 MED ORDER — TECHNETIUM TC 99M TETROFOSMIN IV KIT
30.0000 | PACK | Freq: Once | INTRAVENOUS | Status: AC | PRN
  Administered 2016-12-29: 30 via INTRAVENOUS

## 2016-12-29 MED ORDER — SODIUM CHLORIDE 0.9 % IV SOLN
25.0000 mg | Freq: Once | INTRAVENOUS | Status: AC
  Administered 2016-12-29: 25 mg via INTRAVENOUS
  Filled 2016-12-29 (×2): qty 0.5

## 2016-12-29 MED ORDER — MORPHINE SULFATE (PF) 4 MG/ML IV SOLN: 2.0000 mg | INTRAVENOUS | Status: DC | PRN

## 2016-12-29 MED ORDER — METOCLOPRAMIDE HCL 5 MG/ML IJ SOLN
10.0000 mg | Freq: Once | INTRAMUSCULAR | Status: AC
  Administered 2016-12-29: 10 mg via INTRAVENOUS
  Filled 2016-12-29: qty 2

## 2016-12-29 MED ORDER — GI COCKTAIL ~~LOC~~
30.0000 mL | Freq: Three times a day (TID) | ORAL | Status: DC | PRN
  Administered 2016-12-30: 30 mL via ORAL
  Filled 2016-12-29: qty 30

## 2016-12-29 MED ORDER — BUTALBITAL-APAP-CAFFEINE 50-325-40 MG PO TABS
0.5000 | ORAL_TABLET | Freq: Two times a day (BID) | ORAL | Status: DC | PRN
  Administered 2016-12-30 (×2): 1 via ORAL
  Filled 2016-12-29 (×2): qty 1

## 2016-12-29 MED ORDER — SODIUM CHLORIDE 0.9 % IV SOLN
INTRAVENOUS | Status: DC
  Administered 2016-12-29: 03:00:00 via INTRAVENOUS

## 2016-12-29 NOTE — H&P (Addendum)
History and Physical    Kelli Tran [id number] DOB: July 07, 1971 DOA: 12/28/2016  Referring MD/NP/PA: Saunders Revel PCP: Berkley Harvey, NP  Patient coming from: Home  Chief Complaint: Chest pain  HPI: Kelli Tran is a 46 y.o. female with medical history significant of HTN, migraine headaches, and heart murmur; who presents with complaints chest pain. Symptoms initially started 2 days ago (2/14), and were located substernally. She describes it as a dull heavy achy feeling that would radiated across her chest and into her neck and arms. Associated symptoms included intermittent cough, nausea, vomiting, diaphoresis, dizziness, shortness of breath, and chills. Denies having any significant fever, hemoptysis, leg swelling, recent travel, or family history of clots/ heart disease. Tried to use Tums without relief thinking symptoms were secondary to indigestion. Symptoms progressively worsened to the point which she came in for further evaluation. Patient was since family also known to have recent hysterectomy with bilateral salpingo-oophorectomy back in 05/2016 for pelvic endometriosis and uterine fibroids.  ED Course:  The patient emergency department patient was seen to be afebrile, pulse of up 106, respirations in upper 20s, and all other vitals maintained. Lab work revealed troponin 0.05, d-dimer 0.44, and all other vitals relatively within normal limits. Repeat troponin performed in the ED was negative, and EKG showed no signs of acute ischemia. , Patient was given Ativan, aspirin, Zofran,  Review of Systems: As per HPI otherwise 10 point review of systems negative.   Past Medical History:  Diagnosis Date  . Heart murmur    childhood  . Hypertension   . Migraine   . Migraine without aura, with intractable migraine, so stated, without mention of status migrainosus 04/09/2014  . Other and unspecified ovarian cysts     Past Surgical History:  Procedure Laterality Date  .  BREAST SURGERY    . DIAGNOSTIC LAPAROSCOPY     ectopic  . DILATION AND CURETTAGE OF UTERUS    . MYOMECTOMY    . ROBOTIC ASSISTED TOTAL HYSTERECTOMY WITH SALPINGECTOMY Bilateral 06/01/2016   Procedure: ROBOTIC ASSISTED TOTAL HYSTERECTOMY WITH BILATERAL SALPINGECTOMY;  Surgeon: Servando Salina, MD;  Location: [facility];  Service: Gynecology;  Laterality: Bilateral;     reports that she has never smoked. She has never used smokeless tobacco. She reports that she drinks alcohol. She reports that she does not use drugs.  Allergies  Allergen Reactions  . Contrast Media [Iodinated Diagnostic Agents] Nausea Only and Other (See Comments)    Caused burning.  . Gadolinium Derivatives Nausea Only and Other (See Comments)    CAUSED BURNING  . Levaquin [Levofloxacin In D5w] Other (See Comments)    "inflammation" burning sensation, generalized  . Other Swelling    Walnuts causes swelling of the tongue.  Lavella Lemons [Benzonatate] Swelling    Family History  Problem Relation Age of Onset  . Hypertension Mother   . Diabetes Mother   . Migraines Mother   . Hypertension Father   . Breast cancer Sister   . Hypertension Sister   . Migraines Sister     Prior to Admission medications   Medication Sig Start Date End Date Taking? Authorizing Provider  Biotin 5000 MCG CAPS Take 1 capsule by mouth daily.   Yes Historical Provider, MD  butalbital-acetaminophen-caffeine (FIORICET) 50-325-40 MG tablet Take 0.5-1 tablets by mouth 2 (two) times daily as needed for headache or migraine.   Yes Historical Provider, MD  topiramate (TOPAMAX) 25 MG tablet Take 25-50 mg by mouth See admin instructions. Takes 1  tab in am and 2 tabs in pm 08/28/16  Yes Historical Provider, MD    Physical Exam:    Constitutional: NAD, calm, comfortable Vitals:   12/28/16 2130 12/28/16 2200 12/28/16 2230 12/28/16 2350  BP: 139/93 150/95 (!) 141/115   Pulse: 85 106 95   Resp: 18 (!) 32 23   Temp:      TempSrc:      SpO2: 99%  100% 100% 100%  Weight:      Height:       Eyes: PERRL, lids and conjunctivae normal ENMT: Mucous membranes are moist. Posterior pharynx clear of any exudate or lesions.Normal dentition.  Neck: normal, supple, no masses, no thyromegaly Respiratory: clear to auscultation bilaterally, no wheezing, no crackles. Normal respiratory effort. No accessory muscle use.  Cardiovascular: Regular rate and rhythm, no murmurs / rubs / gallops. No extremity edema. 2+ pedal pulses. No carotid bruits.  Abdomen: no tenderness, no masses palpated. No hepatosplenomegaly. Bowel sounds positive.  Musculoskeletal: no clubbing / cyanosis. No joint deformity upper and lower extremities. Good ROM, no contractures. Normal muscle tone.  Skin: no rashes, lesions, ulcers. No induration Neurologic: CN 2-12 grossly intact. Sensation intact, DTR normal. Strength 5/5 in all 4.  Psychiatric: Normal judgment and insight. Alert and oriented x 3. Normal mood.     Labs on Admission: I have personally reviewed following labs and imaging studies  CBC:  Recent Labs Lab 12/28/16 1908  WBC 8.7  HGB 14.6  HCT 41.0  MCV 81.7  PLT 99991111   Basic Metabolic Panel:  Recent Labs Lab 12/28/16 1908  NA 143  K 4.2  CL 106  CO2 21*  GLUCOSE 92  BUN 8  CREATININE 0.71  CALCIUM 9.7   GFR: Estimated Creatinine Clearance: 71.7 mL/min (by C-G formula based on SCr of 0.71 mg/dL). Liver Function Tests: No results for input(s): AST, ALT, ALKPHOS, BILITOT, PROT, ALBUMIN in the last 168 hours. No results for input(s): LIPASE, AMYLASE in the last 168 hours. No results for input(s): AMMONIA in the last 168 hours. Coagulation Profile: No results for input(s): INR, PROTIME in the last 168 hours. Cardiac Enzymes:  Recent Labs Lab 12/28/16 1908  TROPONINI 0.05*   BNP (last 3 results) No results for input(s): PROBNP in the last 8760 hours. HbA1C: No results for input(s): HGBA1C in the last 72 hours. CBG: No results for  input(s): GLUCAP in the last 168 hours. Lipid Profile: No results for input(s): CHOL, HDL, LDLCALC, TRIG, CHOLHDL, LDLDIRECT in the last 72 hours. Thyroid Function Tests: No results for input(s): TSH, T4TOTAL, FREET4, T3FREE, THYROIDAB in the last 72 hours. Anemia Panel: No results for input(s): VITAMINB12, FOLATE, FERRITIN, TIBC, IRON, RETICCTPCT in the last 72 hours. Urine analysis:    Component Value Date/Time   COLORURINE YELLOW 03/10/2013 [place] 03/10/2013 1248   LABSPEC 1.019 03/10/2013 1248   PHURINE 5.5 03/10/2013 1248   GLUCOSEU NEGATIVE 03/10/2013 1248   HGBUR TRACE (A) 03/10/2013 1248   BILIRUBINUR NEGATIVE 03/10/2013 1248   KETONESUR NEGATIVE 03/10/2013 1248   PROTEINUR NEGATIVE 03/10/2013 1248   UROBILINOGEN 1.0 03/10/2013 1248   NITRITE NEGATIVE 03/10/2013 1248   LEUKOCYTESUR NEGATIVE 03/10/2013 1248   Sepsis Labs: No results found for this or any previous visit (from the past 240 hour(s)).   Radiological Exams on Admission: Dg Chest 2 View  Result Date: 12/28/2016 CLINICAL DATA:  Chest pain and tightness x2 days. Bilateral arm pain. EXAM: CHEST  2 VIEW COMPARISON:  03/10/2013 CXR  FINDINGS: The heart size and mediastinal contours are within normal limits. Both lungs are clear. The visualized skeletal structures are unremarkable. IMPRESSION: No active cardiopulmonary disease. Electronically Signed   By: Ashley Royalty M.D.   On: 12/28/2016 19:31    EKG: Independently reviewed. Normal sinus rhythm with nonspecific T-wave changes  Assessment/Plan Chest pain, rule out: heart score 3 - Admit to telemetry bed - Chest pain order set initiated - Trend cardiac troponins - Check UDS and lipid panel  - Check echocardiogram in a.m.   - Determine if patient needs formal cardiology evaluation in a.m.  Elevated troponin: Patient initial troponin was noted to be elevated at 0.05, but repeat checks were within normal limits. - As seen above  Hx of Essential  hypertension: Patient currently not on any medications. - continue monitor  Migraine headaches  - Continue topiramate and Fioricet prn\  Status post hysterectomy for uterine fibroids and endometriosis   DVT prophylaxis: Lovenox Code Status: Full Family Communication: no  family present at bedside  Disposition Plan:  likely discharge home with workup negative  Consults called: none Admission status: Observation   Norval Morton MD [facility] Pager [phone]  If 7PM-7AM, please contact night-coverage [email or web address] Password [email or web address]  12/29/2016, 12:19 AM

## 2016-12-29 NOTE — Progress Notes (Signed)
Echo and myovue are normal Ok to d/c from our perspective Will sign off  [employer]

## 2016-12-29 NOTE — Progress Notes (Signed)
   12/29/16 1500  Clinical Encounter Type  Visited With Patient and family together  Visit Type Follow-up  Spiritual Encounters  Spiritual Needs Emotional  Stress Factors  Patient Stress Factors None identified  Family Stress Factors None identified  Pt back in room with family. Pt fatigued after procedure. Plans to go over Advanced Directive materials over the weekend. Floor chaplain referred for follow up.

## 2016-12-29 NOTE — Progress Notes (Signed)
Initial Nutrition Assessment  DOCUMENTATION CODES:   Not applicable  INTERVENTION:   When diet advanced after test today, start:   Ensure Enlive po BID, each supplement provides 350 kcal and 20 grams of protein  NUTRITION DIAGNOSIS:   Unintentional weight loss related to poor appetite (associated with new medication) as evidenced by per patient/family report.  GOAL:   Patient will meet greater than or equal to 90% of their needs  MONITOR:   Diet advancement, PO intake, Supplement acceptance, Labs  REASON FOR ASSESSMENT:   Malnutrition Screening Tool    ASSESSMENT:   46 y.o. female with PMH of HTN, migraine headaches, and heart murmur; who came to the hospital on 2/15 with complaints of chest pain.  Patient not in room during RD visit. Spoke with her sister and mother who report that patient lost weight since having a hysterectomy last year, after which she started on a new medication that has decreased her appetite.  She has lost ~11% of her usual weight in the past 6 months. Unable to complete Nutrition-Focused physical exam at this time.  Labs and medications reviewed. Currently NPO for stress test today.  Diet Order:  Diet NPO time specified  Skin:  Reviewed, no issues  Last BM:  unknown  Height:   Ht Readings from Last 1 Encounters:  12/29/16 5\' 2"  (1.575 m)    Weight:   Wt Readings from Last 1 Encounters:  12/29/16 122 lb 8 oz (55.6 kg)    Ideal Body Weight:  50 kg  BMI:  Body mass index is 22.41 kg/m.  Estimated Nutritional Needs:   Kcal:  [id number]  Protein:  70-80 gm  Fluid:  >/= 1.5 L  EDUCATION NEEDS:   No education needs identified at this time  Molli Barrows, [place], [place], [place] Pager [phone] After Hours Pager [phone]

## 2016-12-29 NOTE — Progress Notes (Addendum)
Seen and examined-chart reviewed Has headache this morning-very similar to her usual migraine headache-no response to Topamax so far. No vomiting but continues to be nauseous. Chest pain is much better  On exam Chest is bilaterally clear CVS-S1-S2 regular Abdomen-soft and nontender  Labs: D-dimer negative Troponin negative  Impression Atypical chest pain Migraine headache ?Viral synd  Plan: 1 dose of Toradol with Reglan and Benadryl Await results of nuclear stress test Advanced diet-ambulate-see how she does-if better-home by the end of the day, otherwise may need to remain overnight for further continued care.

## 2016-12-29 NOTE — Consult Note (Signed)
Cardiology Consult    Patient ID: Kelli Tran MRN: [id number], DOB/AGE: 15-Apr-1971   Admit date: 12/28/2016 Date of Consult: 12/29/2016  Primary Physician: Berkley Harvey, NP Primary Cardiologist: None Requesting Provider: Dr. Tamala Julian  Reason for Consult: Chest pain  Patient Profile    46 y/o woman with a history of HTN and migraines who presented to the hospital after chest pain, nausea, and vomiting for one day.  Past Medical History   Past Medical History:  Diagnosis Date  . Heart murmur    childhood  . Hypertension   . Migraine   . Migraine without aura, with intractable migraine, so stated, without mention of status migrainosus 04/09/2014  . Other and unspecified ovarian cysts     Past Surgical History:  Procedure Laterality Date  . BREAST SURGERY    . DIAGNOSTIC LAPAROSCOPY     ectopic  . DILATION AND CURETTAGE OF UTERUS    . MYOMECTOMY    . ROBOTIC ASSISTED TOTAL HYSTERECTOMY WITH SALPINGECTOMY Bilateral 06/01/2016   Procedure: ROBOTIC ASSISTED TOTAL HYSTERECTOMY WITH BILATERAL SALPINGECTOMY;  Surgeon: Servando Salina, MD;  Location: [facility];  Service: Gynecology;  Laterality: Bilateral;     Allergies  Allergies  Allergen Reactions  . Contrast Media [Iodinated Diagnostic Agents] Nausea Only and Other (See Comments)    Caused burning.  . Gadolinium Derivatives Nausea Only and Other (See Comments)    CAUSED BURNING  . Levaquin [Levofloxacin In D5w] Other (See Comments)    "inflammation" burning sensation, generalized  . Other Swelling    Walnuts causes swelling of the tongue.  Lavella Lemons [Benzonatate] Swelling    History of Present Illness    She describes chest pain mostly as a dull ache in the front and back of her chest with radiation into her armsthat started at rest on Wednesday 2/14. She reports associated feeling chills, nausea, and vomiting during this time. She tried taking TUMS without improvement thinking this could be indigestion. She  went to the urgent care for evaluation who recommended her to be seen in the ED for this pain. She has had episodes of nonspecific chest pain in the past but infrequently.  After arrival to the ED she received ativan, ASA, zofran with benefit in her nausea w/ 1 episode of emesis overnight. she had a normal EKG with a mildly elevated troponin of 0.05 that was negative on repeat. CXR was negative for an acute process.  She has no known history of heart disease. She reports having a heart murmur since childhood. She is being titrated down on antihypertensive medications since losing significant weight after hysterectomy last year for fibroids.  Inpatient Medications    . aspirin EC  81 mg Oral Daily  . enoxaparin (LOVENOX) injection  40 mg Subcutaneous Daily  . topiramate  25 mg Oral Daily  . topiramate  50 mg Oral QHS     Outpatient Medications    Prior to Admission medications   Medication Sig Start Date End Date Taking? Authorizing Provider  Biotin 5000 MCG CAPS Take 1 capsule by mouth daily.   Yes Historical Provider, MD  butalbital-acetaminophen-caffeine (FIORICET) 50-325-40 MG tablet Take 0.5-1 tablets by mouth 2 (two) times daily as needed for headache or migraine.   Yes Historical Provider, MD  topiramate (TOPAMAX) 25 MG tablet Take 25-50 mg by mouth See admin instructions. Takes 1 tab in am and 2 tabs in pm 08/28/16  Yes Historical Provider, MD     Family History    Family  History  Problem Relation Age of Onset  . Hypertension Mother   . Diabetes Mother   . Migraines Mother   . Hypertension Father   . Breast cancer Sister   . Hypertension Sister   . Migraines Sister     Social History    Social History   Social History  . Marital status: Divorced    Spouse name: N/A  . Number of children: 2  . Years of education: college   Occupational History  . [employer]   Social History Main Topics  . Smoking status: Never Smoker  . Smokeless tobacco:  Never Used  . Alcohol use Yes     Comment: occ  . Drug use: No  . Sexual activity: Not on file   Other Topics Concern  . Not on file   Social History Narrative  . No narrative on file     Review of Systems    General:  Chills Cardiovascular:  Chest pain Dermatological: No rash Respiratory: No cough Urologic: No hematuria, dysuria Abdominal:   Nausea, vomiting Neurologic:  No visual changes, changes in mental status. All other systems reviewed and are otherwise negative except as noted above.  Physical Exam    Blood pressure 131/90, pulse 82, temperature 98.9 F (37.2 C), temperature source Oral, resp. rate 16, height 5\' 2"  (1.575 m), weight 122 lb 8 oz (55.6 kg), last menstrual period 05/31/2016, SpO2 99 %.  General: Pleasant, NAD Psych: Normal affect Neuro: Alert and oriented X 3. Moves all extremities spontaneously HEENT: Normal, no JVD Neck: Supple without bruits or JVD Lungs:  Resp regular and unlabored, CTA Heart: RRR no s3, s4, or murmurs Abdomen: Soft, nontender Extremities: No clubbing, cyanosis or edema. DP/PT/Radials 2+ and equal bilaterally  Labs    Troponin [facility] of Care Test)  Recent Labs  12/28/16 2245  TROPIPOC 0.00    Recent Labs  12/28/16 1908 12/29/16 0248 12/29/16 0545  TROPONINI 0.05* <0.03 <0.03   Lab Results  Component Value Date   WBC 6.7 12/29/2016   HGB 12.8 12/29/2016   HCT 36.2 12/29/2016   MCV 81.0 12/29/2016   PLT 251 12/29/2016     Recent Labs Lab 12/29/16 0248  NA 139  K 3.5  CL 108  CO2 23  BUN 6  CREATININE 0.65  CALCIUM 8.8*  GLUCOSE 91   Lab Results  Component Value Date   CHOL 183 12/29/2016   HDL 57 12/29/2016   LDLCALC 118 (H) 12/29/2016   TRIG 38 12/29/2016   Lab Results  Component Value Date   DDIMER 0.44 12/28/2016     Radiology Studies    Dg Chest 2 View  Result Date: 12/28/2016 CLINICAL DATA:  Chest pain and tightness x2 days. Bilateral arm pain. EXAM: CHEST  2 VIEW COMPARISON:   03/10/2013 CXR FINDINGS: The heart size and mediastinal contours are within normal limits. Both lungs are clear. The visualized skeletal structures are unremarkable. IMPRESSION: No active cardiopulmonary disease. Electronically Signed   By: Ashley Royalty M.D.   On: 12/28/2016 19:31    ECG & Cardiac Imaging    ECG: Normal sinus rhythm - Personally reviewed Telemetry: Normal sinus rhythm - Personally reviewed  Assessment & Plan    Chest pain: Partially atypical pain ongoing for 2 days without troponin elevation or ECG abnormality is unlikely to be ischemic disease. She did have some large response in heart rate to positional changes during exam but doesn't look volume depleted. Family Hx of stroke  but no early age MI. She has systemic symptoms and vomiting that may not be from a cardiac cause. -We will plan for stress myoview to evaluate any CAD -No further workup at this time if normal function  Hypertension: Normal to mildly elevated. Not in acute exacerbation or causing her symptoms   Collier Salina, MD PGY-II Internal Medicine Resident Pager# [phone] 12/29/2016, 8:23 AM   Patient examined chart reviewed. Benign exam clear lungs no murmur abdomen soft SSCP with GI overtones and nausea. Enzymes negative and non specific ST changes Favor exercise myovue to risk stratify Primary service to further w/u nausea and GI Issues  Jenkins Rouge

## 2016-12-29 NOTE — Progress Notes (Signed)
   Exercise Myoview complete. Radiologist interpretation pending.   Brittainy Rosita Fire 12/29/2016

## 2016-12-29 NOTE — Progress Notes (Signed)
   12/29/16 1020  Clinical Encounter Type  Visited With Family  Visit Type Other (Comment) ([place] consult)  Spiritual Encounters  Spiritual Needs Emotional  Stress Factors  Patient Stress Factors Not reviewed  Family Stress Factors None identified  Pt at procedure. Mother in room. Explained Advanced Directive but will follow up when Pt returns.

## 2016-12-29 NOTE — Progress Notes (Signed)
  Echocardiogram 2D Echocardiogram has been performed.  Jennette Dubin 12/29/2016, 2:32 PM

## 2016-12-30 ENCOUNTER — Observation Stay (HOSPITAL_BASED_OUTPATIENT_CLINIC_OR_DEPARTMENT_OTHER): Payer: 59

## 2016-12-30 ENCOUNTER — Observation Stay (HOSPITAL_COMMUNITY): Payer: 59

## 2016-12-30 DIAGNOSIS — Z8679 Personal history of other diseases of the circulatory system: Secondary | ICD-10-CM | POA: Diagnosis not present

## 2016-12-30 DIAGNOSIS — Z90710 Acquired absence of both cervix and uterus: Secondary | ICD-10-CM | POA: Diagnosis not present

## 2016-12-30 DIAGNOSIS — G459 Transient cerebral ischemic attack, unspecified: Secondary | ICD-10-CM | POA: Diagnosis not present

## 2016-12-30 DIAGNOSIS — G43909 Migraine, unspecified, not intractable, without status migrainosus: Secondary | ICD-10-CM | POA: Diagnosis not present

## 2016-12-30 DIAGNOSIS — R748 Abnormal levels of other serum enzymes: Secondary | ICD-10-CM | POA: Diagnosis not present

## 2016-12-30 DIAGNOSIS — R079 Chest pain, unspecified: Secondary | ICD-10-CM | POA: Diagnosis not present

## 2016-12-30 DIAGNOSIS — I10 Essential (primary) hypertension: Secondary | ICD-10-CM | POA: Diagnosis not present

## 2016-12-30 DIAGNOSIS — R609 Edema, unspecified: Secondary | ICD-10-CM | POA: Diagnosis not present

## 2016-12-30 DIAGNOSIS — R072 Precordial pain: Secondary | ICD-10-CM | POA: Diagnosis not present

## 2016-12-30 LAB — TROPONIN I

## 2016-12-30 MED ORDER — SODIUM CHLORIDE 0.9 % IV SOLN
25.0000 mg | Freq: Four times a day (QID) | INTRAVENOUS | Status: DC | PRN
  Filled 2016-12-30: qty 0.5

## 2016-12-30 MED ORDER — ALPRAZOLAM 0.25 MG PO TABS
0.2500 mg | ORAL_TABLET | Freq: Three times a day (TID) | ORAL | Status: DC | PRN
  Administered 2016-12-30: 0.25 mg via ORAL
  Filled 2016-12-30: qty 1

## 2016-12-30 MED ORDER — METOCLOPRAMIDE HCL 5 MG/ML IJ SOLN: 10.0000 mg | Freq: Four times a day (QID) | INTRAMUSCULAR | Status: DC | PRN

## 2016-12-30 MED ORDER — STROKE: EARLY STAGES OF RECOVERY BOOK
Freq: Once | Status: DC
  Filled 2016-12-30: qty 1

## 2016-12-30 MED ORDER — KETOROLAC TROMETHAMINE 30 MG/ML IJ SOLN: 30.0000 mg | Freq: Four times a day (QID) | INTRAMUSCULAR | Status: DC | PRN

## 2016-12-30 NOTE — Progress Notes (Signed)
PROGRESS NOTE        PATIENT DETAILS Name: Kelli Tran Age: 46 y.o. Sex: female Date of Birth: 1971/04/20 Admit Date: 12/28/2016 Admitting Physician Norval Morton, MD [id number], Sherrill Raring, NP  Brief Narrative: Patient is a 46 y.o. female with past medical history of hypertension (no longer on antihypertensives), migraine headaches presented to the hospital with retrosternal chest pain which she described as heaviness, along with associated nausea and vomiting. She was seen by cardiology, underwent a nuclear stress test that was negative. She continues to have intermittent chest tightness, on 2/17, she had a brief spell of diplopia, vertigo and generalized weakness. Workup in progress. Please see below for further details  Subjective: Has had 2 episodes of retrosternal chest tightness today. Had a episode of diplopia, vertigo and generalized weakness lasting for 3-5 minutes earlier today. She no longer has nausea vomiting.  Assessment/Plan: Atypical chest pain: Continues to have intermittent retrosternal chest discomfort-which she describes as "heaviness". Per patient, this chest discomfort is significantly less severe compared to what she had on admission. Troponins continued to be negative including today. Nuclear stress test on 12/16 and negative for ischemia. 2-D echocardiogram on 2/16 with preserved EF and without any wall motion abnormalities. Her d-dimer on admission was negative, lower extremity Doppler negative for DVT-very low suspicion for PE (Wells criteria-score 0). Suspect mostly noncardiac chest pain-suspect that she may have a component of anxiety. Continue PPI and add as needed Xanax. Note, spoke with cardiology-Dr Nishan-reviewed patient's EKG this morning, and did not recommend any further intervention.  Transient diplopia/vertigo/generalized weakness: Occurred earlier today-lasted for around 3-5 minutes. No focal deficits on exam. Denied  dysarthria-or any focal deficits during this spell. She does have a history of migraine-not sure whether was a complicated migraine-though she denies any headache today (had headache yesterday). CT head negative. Will go ahead and complete workup with a MRI brain and carotid Doppler-although I doubt these will be positive.  Nausea/vomiting/chest pain: Not sure whether she had a viral prodrome-in any event slowly improving with just supportive measures.  ? Anxiety: Start a trial of as needed Xanax.  DVT Prophylaxis: Prophylactic Lovenox   Code Status: Full code   Family Communication: Family member at bedside  Disposition Plan: Remain inpatient- home when workup complete likely on 2/18  Antimicrobial agents: Anti-infectives    None      Procedures: Nuclear stress test 2/16>> no perfusion defect-low risk Transthoracic echocardiogram 2/16>> EF [id number], grade 2 diastolic dysfunction  CONSULTS:  cardiology  Time spent: 25- minutes-Greater than 50% of this time was spent in counseling, explanation of diagnosis, planning of further management, and coordination of care.  MEDICATIONS: Scheduled Meds: .  stroke: mapping our early stages of recovery book   Does not apply Once  . aspirin EC  81 mg Oral Daily  . enoxaparin (LOVENOX) injection  40 mg Subcutaneous Daily  . feeding supplement (ENSURE ENLIVE)  237 mL Oral BID BM  . pantoprazole  40 mg Oral Q1200  . topiramate  25 mg Oral Daily  . topiramate  50 mg Oral QHS   Continuous Infusions: PRN Meds:.acetaminophen, ALPRAZolam, butalbital-acetaminophen-caffeine, ketorolac **FOLLOWED BY** [START ON 01/04/2017] metoCLOPramide (REGLAN) injection **FOLLOWED BY** [START ON 01/10/2017] diphenhydrAMINE (BENADRYL) IVPB(SICKLE CELL ONLY), gi cocktail, morphine injection, nitroGLYCERIN, ondansetron (ZOFRAN) IV   PHYSICAL EXAM: Vital signs: Vitals:   12/29/16 2008 12/30/16  0500 12/30/16 0700 12/30/16 1053  BP: 120/83 120/87 (!) 118/101 (!)  131/96  Pulse: 83 68  73  Resp: 16 16 20    Temp: 99 F (37.2 C) 98.4 F (36.9 C) 98.7 F (37.1 C)   TempSrc: Oral Oral    SpO2: 95% 98% 98%   Weight:  55.7 kg (122 lb 14.4 oz)    Height:       Filed Weights   12/28/16 1903 12/29/16 0200 12/30/16 0500  Weight: 55.8 kg (123 lb) 55.6 kg (122 lb 8 oz) 55.7 kg (122 lb 14.4 oz)   Body mass index is 22.48 kg/m.   General appearance :Awake, alert, not in any distress. Speech Clear. Not toxic Looking Eyes:, pupils equally reactive to light and accomodation,no scleral icterus.Pink conjunctiva HEENT: Atraumatic and Normocephalic Neck: supple, no JVD. No cervical lymphadenopathy. No thyromegaly Resp:Good air entry bilaterally, no added sounds  CVS: S1 S2 regular, no murmurs.  GI: Bowel sounds present, Non tender and not distended with no gaurding, rigidity or rebound.No organomegaly Extremities: B/L Lower Ext shows no edema, both legs are warm to touch Neurology:  speech clear,Non focal, sensation is grossly intact. Psychiatric: Normal judgment and insight. Alert and oriented x 3. Normal mood. Musculoskeletal:No digital cyanosis Skin:No Rash, warm and dry Wounds:N/A  I have personally reviewed following labs and imaging studies  LABORATORY DATA: CBC:  Recent Labs Lab 12/28/16 1908 12/29/16 0248  WBC 8.7 6.7  NEUTROABS  --  5.1  HGB 14.6 12.8  HCT 41.0 36.2  MCV 81.7 81.0  PLT 277 [id number]    Basic Metabolic Panel:  Recent Labs Lab 12/28/16 1908 12/29/16 0248  NA 143 139  K 4.2 3.5  CL 106 108  CO2 21* 23  GLUCOSE 92 91  BUN 8 6  CREATININE 0.71 0.65  CALCIUM 9.7 8.8*    GFR: Estimated Creatinine Clearance: 71.7 mL/min (by C-G formula based on SCr of 0.65 mg/dL).  Liver Function Tests: No results for input(s): AST, ALT, ALKPHOS, BILITOT, PROT, ALBUMIN in the last 168 hours. No results for input(s): LIPASE, AMYLASE in the last 168 hours. No results for input(s): AMMONIA in the last 168 hours.  Coagulation  Profile: No results for input(s): INR, PROTIME in the last 168 hours.  Cardiac Enzymes:  Recent Labs Lab 12/28/16 1908 12/29/16 0248 12/29/16 0545 12/29/16 1208 12/30/16 1004  TROPONINI 0.05* <0.03 <0.03 <0.03 <0.03    BNP (last 3 results) No results for input(s): PROBNP in the last 8760 hours.  HbA1C: No results for input(s): HGBA1C in the last 72 hours.  CBG: No results for input(s): GLUCAP in the last 168 hours.  Lipid Profile:  Recent Labs  12/29/16 0725  CHOL 183  HDL 57  LDLCALC 118*  TRIG 38  CHOLHDL 3.2    Thyroid Function Tests: No results for input(s): TSH, T4TOTAL, FREET4, T3FREE, THYROIDAB in the last 72 hours.  Anemia Panel: No results for input(s): VITAMINB12, FOLATE, FERRITIN, TIBC, IRON, RETICCTPCT in the last 72 hours.  Urine analysis:    Component Value Date/Time   COLORURINE YELLOW 12/29/2016 1448   APPEARANCEUR CLEAR 12/29/2016 1448   LABSPEC 1.009 12/29/2016 1448   PHURINE 7.0 12/29/2016 1448   GLUCOSEU NEGATIVE 12/29/2016 1448   HGBUR NEGATIVE 12/29/2016 1448   BILIRUBINUR NEGATIVE 12/29/2016 1448   KETONESUR NEGATIVE 12/29/2016 1448   PROTEINUR NEGATIVE 12/29/2016 1448   UROBILINOGEN 1.0 03/10/2013 1248   NITRITE NEGATIVE 12/29/2016 1448   LEUKOCYTESUR NEGATIVE 12/29/2016 1448    Sepsis  Labs: Lactic Acid, Venous No results found for: LATICACIDVEN  MICROBIOLOGY: No results found for this or any previous visit (from the past 240 hour(s)).  RADIOLOGY STUDIES/RESULTS: Dg Chest 2 View  Result Date: 12/28/2016 CLINICAL DATA:  Chest pain and tightness x2 days. Bilateral arm pain. EXAM: CHEST  2 VIEW COMPARISON:  03/10/2013 CXR FINDINGS: The heart size and mediastinal contours are within normal limits. Both lungs are clear. The visualized skeletal structures are unremarkable. IMPRESSION: No active cardiopulmonary disease. Electronically Signed   By: Ashley Royalty M.D.   On: 12/28/2016 19:31   Ct Head Wo Contrast  Result Date:  12/30/2016 CLINICAL DATA:  Headaches, dizziness, double vision, history of migraines, hypertension EXAM: CT HEAD WITHOUT CONTRAST TECHNIQUE: Contiguous axial images were obtained from the base of the skull through the vertex without intravenous contrast. COMPARISON:  06/17/2009 FINDINGS: Brain: Normal ventricular morphology. No midline shift or mass effect. Normal appearance of brain parenchyma. No intracranial hemorrhage, mass lesion, evidence of acute infarction, or extra-axial fluid collection. Vascular: Unremarkable Skull: Intact Sinuses/Orbits: Clear paranasal sinuses and mastoid air cells. Other: N/A IMPRESSION: Normal exam, unchanged. Electronically Signed   By: Lavonia Dana M.D.   On: 12/30/2016 14:09   Nm Myocar Multi W/spect W/wall Motion / Ef  Result Date: 12/29/2016 CLINICAL DATA:  Chest pain EXAM: MYOCARDIAL IMAGING WITH SPECT (REST AND PHARMACOLOGIC-STRESS) GATED LEFT VENTRICULAR WALL MOTION STUDY LEFT VENTRICULAR EJECTION FRACTION TECHNIQUE: Standard myocardial SPECT imaging was performed after resting intravenous injection of 10 mCi Tc-110m tetrofosmin. Subsequently, intravenous infusion of Lexiscan was performed under the supervision of the Cardiology staff. At peak effect of the drug, 30 mCi Tc-96m tetrofosmin was injected intravenously and standard myocardial SPECT imaging was performed. Quantitative gated imaging was also performed to evaluate left ventricular wall motion, and estimate left ventricular ejection fraction. COMPARISON:  None. FINDINGS: Perfusion: Allowing for breast attenuation artifact, there are no perfusion defects. Wall Motion: Normal left ventricular wall motion. No left ventricular dilation. Left Ventricular Ejection Fraction: 69 % End diastolic volume 63 ml End systolic volume 19 ml IMPRESSION: 1. Allowing for breast attenuation artifact, there are no perfusion defects. 2. Normal left ventricular wall motion. 3. Left ventricular ejection fraction 69% 4. Non invasive risk  stratification*: Low risk. *2012 Appropriate Use Criteria for Coronary Revascularization Focused Update: J Am Coll Cardiol. [id number]. http://content.airportbarriers.com.aspx?articleid=[email or web address] Electronically Signed   By: Marybelle Killings M.D.   On: 12/29/2016 13:24     LOS: 0 days   Oren Binet, MD  [facility] Pager:[phone]  If 7PM-7AM, please contact night-coverage [email or web address] Password [facility] 12/30/2016, 3:13 PM

## 2016-12-30 NOTE — Progress Notes (Signed)
Some mild chest pressure this am-"but not as severe as what it was 2 days back". No nausea.No SOB. Headache has resolved. EKF-reviewed with Dr Jackelyn Hoehn due to J point. No further recommendations from cardiology-apart from supportive care. Check Trop-but with negative NST/Echo. Doubt further work up required for atypical chest pain. Low Wells criteria with neg D Dimer-therefore doubt PE.

## 2016-12-30 NOTE — Progress Notes (Signed)
**  Preliminary report by tech**  Bilateral lower extremity venous duplex completed. There is no evidence of deep or superficial vein thrombosis involving the right and left lower extremities. All visualized vessels appear patent and compressible. There is no evidence of Baker's cysts bilaterally.  12/30/16 11:48 AM Kelli Tran RVT

## 2016-12-31 ENCOUNTER — Observation Stay (HOSPITAL_BASED_OUTPATIENT_CLINIC_OR_DEPARTMENT_OTHER): Payer: 59

## 2016-12-31 DIAGNOSIS — R42 Dizziness and giddiness: Secondary | ICD-10-CM

## 2016-12-31 DIAGNOSIS — R079 Chest pain, unspecified: Secondary | ICD-10-CM | POA: Diagnosis not present

## 2016-12-31 LAB — LIPID PANEL
CHOL/HDL RATIO: 3.4 ratio
CHOLESTEROL: 141 mg/dL (ref 0–200)
HDL: 42 mg/dL (ref >40)
LDL CALC: 79 mg/dL (ref 0–99)
Triglycerides: 101 mg/dL (ref <150)
VLDL: 20 mg/dL (ref 0–40)

## 2016-12-31 MED ORDER — ASPIRIN 81 MG PO TBEC: 81.0000 mg | DELAYED_RELEASE_TABLET | Freq: Every day | ORAL | 0 refills | Status: DC

## 2016-12-31 NOTE — Discharge Instructions (Signed)
Follow with Primary MD Berkley Harvey, NP in 7 days   Get CBC, CMP, 2 view Chest X ray checked  by Primary MD or SNF MD in 5-7 days ( we routinely change or add medications that can affect your baseline labs and fluid status, therefore we recommend that you get the mentioned basic workup next visit with your PCP, your PCP may decide not to get them or add new tests based on their clinical decision)  Activity: As tolerated with Full fall precautions use walker/cane & assistance as needed  Disposition Home   Diet:    Heart Healthy    For Heart failure patients - Check your Weight same time everyday, if you gain over 2 pounds, or you develop in leg swelling, experience more shortness of breath or chest pain, call your Primary MD immediately. Follow Cardiac Low Salt Diet and 1.5 lit/day fluid restriction.  On your next visit with your primary care physician please Get Medicines reviewed and adjusted.  Please request your Prim.MD to go over all Hospital Tests and Procedure/Radiological results at the follow up, please get all Hospital records sent to your Prim MD by signing hospital release before you go home.  If you experience worsening of your admission symptoms, develop shortness of breath, life threatening emergency, suicidal or homicidal thoughts you must seek medical attention immediately by calling 911 or calling your MD immediately  if symptoms less severe.  You Must read complete instructions/literature along with all the possible adverse reactions/side effects for all the Medicines you take and that have been prescribed to you. Take any new Medicines after you have completely understood and accpet all the possible adverse reactions/side effects.   Do not drive, operate heavy machinery, perform activities at heights, swimming or participation in water activities or provide baby sitting services if your were admitted for syncope or siezures until you have seen by Primary MD or a Neurologist  and advised to do so again.  Do not drive when taking Pain medications.    Do not take more than prescribed Pain, Sleep and Anxiety Medications  Special Instructions: If you have smoked or chewed Tobacco  in the last 2 yrs please stop smoking, stop any regular Alcohol  and or any Recreational drug use.  Wear Seat belts while driving.   Please note  You were cared for by a hospitalist during your hospital stay. If you have any questions about your discharge medications or the care you received while you were in the hospital after you are discharged, you can call the unit and asked to speak with the hospitalist on call if the hospitalist that took care of you is not available. Once you are discharged, your primary care physician will handle any further medical issues. Please note that NO REFILLS for any discharge medications will be authorized once you are discharged, as it is imperative that you return to your primary care physician (or establish a relationship with a primary care physician if you do not have one) for your aftercare needs so that they can reassess your need for medications and monitor your lab values.

## 2016-12-31 NOTE — Progress Notes (Signed)
VASCULAR LAB PRELIMINARY  PRELIMINARY  PRELIMINARY  PRELIMINARY  Carotid duplex completed.    Preliminary report: No significant ICA stenosis. Vertebral artery flow is antegrade.   Eashan Schipani, RVT 12/31/2016, 9:08 AM

## 2016-12-31 NOTE — Evaluation (Signed)
Physical Therapy Evaluation and DISCHARGE Patient Details Name: Kelli Tran MRN: [id number] DOB: 1971/03/06 Today's Date: 12/31/2016   History of Present Illness   Kelli Tran is a 46 y.o. female with medical history significant of HTN, migraine headaches, and heart murmur; who presents with complaints chest pain.   Clinical Impression  Pt admitted with above diagnosis. Pt currently with functional limitations due to the deficits listed below (see PT Problem List). Pt functioning at baseline.  Pt with no further acute PT needs at this time.  PT SIGNING OFF. Please re-consult if needed in future.     Follow Up Recommendations No PT follow up    Equipment Recommendations  None recommended by PT    Recommendations for Other Services       Precautions / Restrictions Precautions Precautions: None Restrictions Weight Bearing Restrictions: No      Mobility  Bed Mobility Overal bed mobility: Independent             General bed mobility comments: hob flat  Transfers Overall transfer level: Independent Equipment used: None             General transfer comment: not difficulty and safe  Ambulation/Gait Ambulation/Gait assistance: Modified independent (Device/Increase time) Ambulation Distance (Feet): 300 Feet Assistive device: None Gait Pattern/deviations: WFL(Within Functional Limits) Gait velocity: decreased/guarded due to nausea Gait velocity interpretation: at or above normal speed for age/gender General Gait Details: no episodes of LOB or instabilty  Stairs            Wheelchair Mobility    Modified Rankin (Stroke Patients Only)       Balance Overall balance assessment: No apparent balance deficits (not formally assessed)                               Standardized Balance Assessment Standardized Balance Assessment : Dynamic Gait Index   Dynamic Gait Index Level Surface: Normal Change in Gait Speed: Mild  Impairment Gait with Horizontal Head Turns: Normal Gait with Vertical Head Turns: Normal Gait and Pivot Turn: Normal Step Over Obstacle: Normal Step Around Obstacles: Normal Steps: Mild Impairment Total Score: 22       Pertinent Vitals/Pain Pain Assessment: No/denies pain    Home Living Family/patient expects to be discharged to:: Private residence Living Arrangements: Children Available Help at Discharge: Family;Available PRN/intermittently (44 yo and 46 yo) Type of Home: House Home Access: Stairs to enter Entrance Stairs-Rails: None Entrance Stairs-Number of Steps: 1 Home Layout: One level Home Equipment: None      Prior Function Level of Independence: Independent         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    Extremity/Trunk Assessment   Upper Extremity Assessment Upper Extremity Assessment: Overall WFL for tasks assessed    Lower Extremity Assessment Lower Extremity Assessment: Overall WFL for tasks assessed    Cervical / Trunk Assessment Cervical / Trunk Assessment: Normal  Communication   Communication: No difficulties  Cognition Arousal/Alertness: Awake/alert Behavior During Therapy: Flat affect Overall Cognitive Status: Within Functional Limits for tasks assessed                      General Comments      Exercises     Assessment/Plan    PT Assessment Patent does not need any further PT services  PT Problem List  PT Treatment Interventions      PT Goals (Current goals can be found in the Care Plan section)  Acute Rehab PT Goals Patient Stated Goal: home PT Goal Formulation: All assessment and education complete, DC therapy    Frequency     Barriers to discharge        Co-evaluation               End of Session Equipment Utilized During Treatment: Gait belt Activity Tolerance: Patient tolerated treatment well Patient left: in bed;with call  bell/phone within reach Nurse Communication: Mobility status    Functional Assessment Tool Used: clinical judgement Functional Limitation: Mobility: Walking and moving around Mobility: Walking and Moving Around Current Status [id number]): At least 1 percent but less than 20 percent impaired, limited or restricted Mobility: Walking and Moving Around Goal Status [phone]): At least 1 percent but less than 20 percent impaired, limited or restricted Mobility: Walking and Moving Around Discharge Status [phone]): At least 1 percent but less than 20 percent impaired, limited or restricted    Time: 1012-1031 PT Time Calculation (min) (ACUTE ONLY): 19 min   Charges:   PT Evaluation $PT Eval Low Complexity: 1 Procedure     PT G Codes:   PT G-Codes **NOT FOR INPATIENT CLASS** Functional Assessment Tool Used: clinical judgement Functional Limitation: Mobility: Walking and moving around Mobility: Walking and Moving Around Current Status [id number]): At least 1 percent but less than 20 percent impaired, limited or restricted Mobility: Walking and Moving Around Goal Status [phone]): At least 1 percent but less than 20 percent impaired, limited or restricted Mobility: Walking and Moving Around Discharge Status [phone]): At least 1 percent but less than 20 percent impaired, limited or restricted    Berline Lopes 12/31/2016, 10:37 AM  Kittie Plater, PT, DPT Pager #: [phone] Office #: [phone]

## 2016-12-31 NOTE — Discharge Summary (Signed)
Kelli Tran [id number] DOB: 10/16/1971 DOA: 12/28/2016  PCP: Berkley Harvey, NP  Admit date: 12/28/2016  Discharge date: 12/31/2016  Admitted From: Home  Disposition:  Home   Recommendations for Outpatient Follow-up:   Follow up with PCP in 1-2 weeks  PCP Please obtain BMP/CBC, 2 view CXR in 1week,  (see Discharge instructions)   PCP Please follow up on the following pending results: None   Home Health: None   Equipment/Devices: None  Consultations: None Discharge Condition: Stable   CODE STATUS: Full   Diet Recommendation:  Heart Healthy    Chief Complaint  Patient presents with  . Chest Pain     Brief history of present illness from the day of admission and additional interim summary    Kelli Tran is a 46 y.o. female with medical history significant of HTN, migraine headaches, and heart murmur; who presents with complaints chest pain. Symptoms initially started 2 days ago (2/14), and were located [facility] Course    Atypical chest pain: Ruled out for MI, negative Stress and TTE, seen and cleared by Cards for home DC, symptom free, follow with PCP, PCP to get Carotid.  Transient diplopia/vertigo/generalized weakness: Occurred yesterday for around 3-5 minutes. No focal deficits on exam. Denied dysarthria-or any focal deficits during this spell. MRI negative along with negative carotid duplex.  Nausea/vomiting/chest pain: All resolved.   Discharge diagnosis     Principal Problem:   Chest pain Active Problems:   S/P hysterectomy   History of essential hypertension   Elevated troponin    Discharge instructions    Discharge Instructions    Diet - low sodium heart healthy    Complete by:  As  directed    Discharge instructions    Complete by:  As directed    Follow with Primary MD Berkley Harvey, NP in 7 days   Get CBC, CMP, 2 view Chest X ray checked  by Primary MD or SNF MD in 5-7 days ( we routinely change or add medications that can affect your baseline labs and fluid status, therefore we recommend that you get the mentioned basic workup next visit with your PCP, your PCP may decide not to get them or add new tests based on their clinical decision)  Activity: As tolerated with Full fall precautions use walker/cane & assistance as needed  Disposition Home   Diet:    Heart Healthy    For Heart failure patients - Check your Weight same time everyday, if you gain over 2 pounds, or you develop in leg swelling, experience more shortness of breath or chest pain, call your Primary MD immediately. Follow  Cardiac Low Salt Diet and 1.5 lit/day fluid restriction.  On your next visit with your primary care physician please Get Medicines reviewed and adjusted.  Please request your Prim.MD to go over all Hospital Tests and Procedure/Radiological results at the follow up, please get all Hospital records sent to your Prim MD by signing hospital release before you go home.  If you experience worsening of your admission symptoms, develop shortness of breath, life threatening emergency, suicidal or homicidal thoughts you must seek medical attention immediately by calling 911 or calling your MD immediately  if symptoms less severe.  You Must read complete instructions/literature along with all the possible adverse reactions/side effects for all the Medicines you take and that have been prescribed to you. Take any new Medicines after you have completely understood and accpet all the possible adverse reactions/side effects.   Do not drive, operate heavy machinery, perform activities at heights, swimming or participation in water activities or provide baby sitting services if your were admitted for  syncope or siezures until you have seen by Primary MD or a Neurologist and advised to do so again.  Do not drive when taking Pain medications.    Do not take more than prescribed Pain, Sleep and Anxiety Medications  Special Instructions: If you have smoked or chewed Tobacco  in the last 2 yrs please stop smoking, stop any regular Alcohol  and or any Recreational drug use.  Wear Seat belts while driving.   Please note  You were cared for by a hospitalist during your hospital stay. If you have any questions about your discharge medications or the care you received while you were in the hospital after you are discharged, you can call the unit and asked to speak with the hospitalist on call if the hospitalist that took care of you is not available. Once you are discharged, your primary care physician will handle any further medical issues. Please note that NO REFILLS for any discharge medications will be authorized once you are discharged, as it is imperative that you return to your primary care physician (or establish a relationship with a primary care physician if you do not have one) for your aftercare needs so that they can reassess your need for medications and monitor your lab values.   Increase activity slowly    Complete by:  As directed       Discharge Medications   Allergies as of 12/31/2016      Reactions   Contrast Media [iodinated Diagnostic Agents] Nausea Only, Other (See Comments)   Caused burning.   Gadolinium Derivatives Nausea Only, Other (See Comments)   CAUSED BURNING   Levaquin [levofloxacin In D5w] Other (See Comments)   "inflammation" burning sensation, generalized   Other Swelling   Walnuts causes swelling of the tongue.   Tessalon [benzonatate] Swelling      Medication List    TAKE these medications   Biotin 5000 MCG Caps Take 1 capsule by mouth daily.   FIORICET 50-325-40 MG tablet Generic drug:  butalbital-acetaminophen-caffeine Take 0.5-1 tablets by  mouth 2 (two) times daily as needed for headache or migraine.   topiramate 25 MG tablet Commonly known as:  TOPAMAX Take 25-50 mg by mouth See admin instructions. Takes 1 tab in am and 2 tabs in pm       Follow-up Information    Berkley Harvey, NP. Schedule an appointment as soon as possible for a visit in 1 week(s).   Specialty:  Nurse Practitioner Contact information:  [place] [address] [phone]           Major procedures and Radiology Reports - PLEASE review detailed and final reports thoroughly  -      TTE  Left ventricle: The cavity size was normal. Wall thickness was normal. Systolic function was normal. The estimated ejection fraction was in the range of 60% to 65%. Wall motion was normal;  there were no regional wall motion abnormalities. Features are consistent with a pseudonormal left ventricular filling pattern, with concomitant abnormal relaxation and increased filling   pressure (grade 2 diastolic dysfunction).  Pulmonary arteries: Systolic pressure was mildly increased. PA peak pressure: 33 mm Hg (S).  Bilateral lower extremity venous duplex - Prelim  -  There is no evidence of deep or superficial vein thrombosis involving the right and left lower extremities. All visualized vessels appear patent and compressible. There is no evidence of Baker's cysts bilaterally.  Carotid duplex completed.  Preliminary report: No significant ICA stenosis. Vertebral artery flow is antegrade.    Dg Chest 2 View  Result Date: 12/28/2016 CLINICAL DATA:  Chest pain and tightness x2 days. Bilateral arm pain. EXAM: CHEST  2 VIEW COMPARISON:  03/10/2013 CXR FINDINGS: The heart size and mediastinal contours are within normal limits. Both lungs are clear. The visualized skeletal structures are unremarkable. IMPRESSION: No active cardiopulmonary disease. Electronically Signed   By: Ashley Royalty M.D.   On: 12/28/2016 19:31   Ct Head Wo Contrast  Result Date:  12/30/2016 CLINICAL DATA:  Headaches, dizziness, double vision, history of migraines, hypertension EXAM: CT HEAD WITHOUT CONTRAST TECHNIQUE: Contiguous axial images were obtained from the base of the skull through the vertex without intravenous contrast. COMPARISON:  06/17/2009 FINDINGS: Brain: Normal ventricular morphology. No midline shift or mass effect. Normal appearance of brain parenchyma. No intracranial hemorrhage, mass lesion, evidence of acute infarction, or extra-axial fluid collection. Vascular: Unremarkable Skull: Intact Sinuses/Orbits: Clear paranasal sinuses and mastoid air cells. Other: N/A IMPRESSION: Normal exam, unchanged. Electronically Signed   By: Lavonia Dana M.D.   On: 12/30/2016 14:09   Mr Brain Wo Contrast  Result Date: 12/30/2016 CLINICAL DATA:  46 year old female with chest pain, dizziness, headache, double vision, diaphoresis. Initial encounter. EXAM: MRI HEAD WITHOUT CONTRAST TECHNIQUE: Multiplanar, multiecho pulse sequences of the brain and surrounding structures were obtained without intravenous contrast. COMPARISON:  Head CT without contrast 1357 hours today and earlier. FINDINGS: Brain: Cerebral volume is normal. No restricted diffusion to suggest acute infarction. No midline shift, mass effect, evidence of mass lesion, ventriculomegaly, extra-axial collection or acute intracranial hemorrhage. Cervicomedullary junction and pituitary are within normal limits. Pearline Cables and white matter signal is within normal limits throughout the brain. No definite chronic cerebral blood products. There is a punctate calcification along the right hippocampal gyrus which is stable and nonspecific. No encephalomalacia identified. Vascular: Major intracranial vascular flow voids are preserved and appear normal. The distal right vertebral artery appears mildly dominant. Skull and upper cervical spine: Negative. Normal bone marrow signal. Sinuses/Orbits: Negative orbits soft tissues. Visualized paranasal  sinuses and mastoids are stable and well pneumatized. Other: Grossly normal visible internal auditory structures. Negative scalp soft tissues. IMPRESSION: Normal noncontrast MRI appearance of the brain. Electronically Signed   By: Genevie Ann M.D.   On: 12/30/2016 16:49   Nm Myocar Multi W/spect W/wall Motion / Ef  Result Date: 12/29/2016 CLINICAL DATA:  Chest pain EXAM: MYOCARDIAL IMAGING WITH SPECT (REST AND PHARMACOLOGIC-STRESS) GATED LEFT VENTRICULAR WALL MOTION STUDY  LEFT VENTRICULAR EJECTION FRACTION TECHNIQUE: Standard myocardial SPECT imaging was performed after resting intravenous injection of 10 mCi Tc-67m tetrofosmin. Subsequently, intravenous infusion of Lexiscan was performed under the supervision of the Cardiology staff. At peak effect of the drug, 30 mCi Tc-24m tetrofosmin was injected intravenously and standard myocardial SPECT imaging was performed. Quantitative gated imaging was also performed to evaluate left ventricular wall motion, and estimate left ventricular ejection fraction. COMPARISON:  None. FINDINGS: Perfusion: Allowing for breast attenuation artifact, there are no perfusion defects. Wall Motion: Normal left ventricular wall motion. No left ventricular dilation. Left Ventricular Ejection Fraction: 69 % End diastolic volume 63 ml End systolic volume 19 ml IMPRESSION: 1. Allowing for breast attenuation artifact, there are no perfusion defects. 2. Normal left ventricular wall motion. 3. Left ventricular ejection fraction 69% 4. Non invasive risk stratification*: Low risk. *2012 Appropriate Use Criteria for Coronary Revascularization Focused Update: J Am Coll Cardiol. N6492421. http://content.[email or web address].aspx?[email or web address] Electronically Signed   By: Marybelle Killings M.D.   On: 12/29/2016 13:24    Micro Results     No results found for this or any previous visit (from the past 240 hour(s)).  Today   Subjective    Aybree Maragh-Free today has no headache,no  chest abdominal pain,no new weakness tingling or numbness, feels much better wants to go home today.     Objective   Blood pressure 118/83, pulse 66, temperature 98.1 F (36.7 C), temperature source Oral, resp. rate 16, height 5\' 2"  (1.575 m), weight 55.7 kg (122 lb 14.4 oz), last menstrual period 05/31/2016, SpO2 100 %.   Intake/Output Summary (Last 24 hours) at 12/31/16 1059 Last data filed at 12/30/16 2130  Gross per 24 hour  Intake              240 ml  Output                0 ml  Net              240 ml    Exam Awake Alert, Oriented x 3, No new F.N deficits, Normal affect [place].AT,PERRAL Supple Neck,No JVD, No cervical lymphadenopathy appriciated.  Symmetrical Chest wall movement, Good air movement bilaterally, CTAB RRR,No Gallops,Rubs or new Murmurs, No Parasternal Heave +ve B.Sounds, Abd Soft, Non tender, No organomegaly appriciated, No rebound -guarding or rigidity. No Cyanosis, Clubbing or edema, No new Rash or bruise   Data Review   CBC w Diff:  Lab Results  Component Value Date   WBC 6.7 12/29/2016   HGB 12.8 12/29/2016   HCT 36.2 12/29/2016   PLT 251 12/29/2016   LYMPHOPCT 18 12/29/2016   MONOPCT 5 12/29/2016   EOSPCT 0 12/29/2016   BASOPCT 0 12/29/2016    CMP:  Lab Results  Component Value Date   NA 139 12/29/2016   K 3.5 12/29/2016   CL 108 12/29/2016   CO2 23 12/29/2016   BUN 6 12/29/2016   CREATININE 0.65 12/29/2016   No results found for: HGBA1C   Lab Results  Component Value Date   CHOL 141 12/31/2016   HDL 42 12/31/2016   LDLCALC 79 12/31/2016   TRIG 101 12/31/2016   CHOLHDL 3.4 12/31/2016    Total Time in preparing paper work, data evaluation and todays exam - 35 minutes  Lala Lund K M.D on 12/31/2016 at [facility]   Office  [phone]

## 2017-01-01 LAB — HEMOGLOBIN A1C
HEMOGLOBIN A1C: 5.3 % (ref 4.8–5.6)
MEAN PLASMA GLUCOSE: 105 mg/dL

## 2017-01-24 ENCOUNTER — Encounter: Payer: Self-pay | Admitting: Neurology

## 2017-01-24 ENCOUNTER — Ambulatory Visit (INDEPENDENT_AMBULATORY_CARE_PROVIDER_SITE_OTHER): Payer: 59 | Admitting: Neurology

## 2017-01-24 VITALS — BP 122/80 | HR 74 | Resp 14 | Ht 61.0 in | Wt 130.0 lb

## 2017-01-24 DIAGNOSIS — G478 Other sleep disorders: Secondary | ICD-10-CM

## 2017-01-24 DIAGNOSIS — Z9289 Personal history of other medical treatment: Secondary | ICD-10-CM | POA: Diagnosis not present

## 2017-01-24 DIAGNOSIS — Z820 Family history of epilepsy and other diseases of the nervous system: Secondary | ICD-10-CM | POA: Diagnosis not present

## 2017-01-24 DIAGNOSIS — R0689 Other abnormalities of breathing: Secondary | ICD-10-CM | POA: Diagnosis not present

## 2017-01-24 DIAGNOSIS — R51 Headache: Secondary | ICD-10-CM

## 2017-01-24 DIAGNOSIS — G479 Sleep disorder, unspecified: Secondary | ICD-10-CM

## 2017-01-24 DIAGNOSIS — R400 Somnolence: Secondary | ICD-10-CM | POA: Diagnosis not present

## 2017-01-24 DIAGNOSIS — R519 Headache, unspecified: Secondary | ICD-10-CM

## 2017-01-24 DIAGNOSIS — R079 Chest pain, unspecified: Secondary | ICD-10-CM | POA: Diagnosis not present

## 2017-01-24 NOTE — Patient Instructions (Signed)

## 2017-01-24 NOTE — Progress Notes (Signed)
Subjective:    Patient ID: Kelli Tran is a 46 y.o. female.  HPI     Star Age, MD, PhD [facility] [address].O. [place], [place] [address]  Dear Kelli Tran,   I saw your patient, Kelli Tran, upon your kind request, in my neurologic clinic today for initial consultation of her sleep disorder, in particular, concern for underlying obstructive sleep apnea. The patient is unaccompanied today. As you know, [name] is a 46 year old right-handed woman with an underlying medical history of migraine headaches (has seen Dr. Jannifer Franklin before), hypertension, heart murmur, recent admission to the hospital for chest pain and vertigo, who c/o AH AHs, choking sensation at night, and excessive daytime somnolence. She has woken herself up with a sense of gasping for air. She has occasional morning headaches. These seem to be somewhat different from her migraine headaches in that they are more dull and achy. Bedtime is between 9 and 9:30 PM but she often does not fall asleep until 11 or 11:30 even. She watches TV for an hour or so in bed but turns it off. Wakeup time is 6 AM and often she is already awake around 4 AM. She denies night to night nocturia or restless leg symptoms. Her sister has been recently diagnosed with sleep apnea and is pursuing CPAP therapy as I understand. She has lost weight due to loss of appetite, attributes this to taking topamax.  She is unsure if she snores as she sleeps alone. Her children are 30 years old and 36 years old. Her Epworth sleepiness score is 12 out of 24 today, her fatigue score is 37 out of 63. She is divorced, she lives with her 2 children. She works for [employer]. She is a nonsmoker, drinks alcohol rarely, drinks caffeine in the form of coffee, usually one cup per day. She takes a nap when she can, feels sluggish.  She denies RLS symptoms, but restless sleeper.   Of note, she was recently hospitalized  from 12/28/2016 through 12/31/2016 with negative workup for acute coronary syndrome including negative stress test and TTE. She also had a brain MRI without contrast on 12/30/2016 which I reviewed: Impression, normal noncontrast MRI appearance of the brain. She had a head CT without contrast on 12/30/2016 which showed normal findings. Echocardiogram on 12/29/2016 showed an EF of 60-65%, normal wall motion, normal left ventricular size and wall thickness. Carotid Doppler testing on 12/31/2016 showed 1-39% ICA plaquing bilaterally. Vertebral artery flow was antegrade. Lower extremity venous Doppler studies on 12/30/2016 showed no evidence of DVT or Baker cyst. I reviewed your office note from 01/02/2017, which you kindly included. She had that test at the time including CBC with differential, ferritin, CMP, ANA, TSH, free T4, she was advised to undergo a CT chest without contrast. She was reporting chest pressure at the time. She also reported waking up with a sense of gasping for air. She was started on a trial of Dexilant. She has not noticed any improvement since starting the med.   Her Past Medical History Is Significant For: Past Medical History:  Diagnosis Date  . Heart murmur    childhood  . Hypertension   . Migraine   . Migraine without aura, with intractable migraine, so stated, without mention of status migrainosus 04/09/2014  . Other and unspecified ovarian cysts     Her Past Surgical History Is Significant For: Past Surgical History:  Procedure Laterality Date  . BREAST SURGERY    .  DIAGNOSTIC LAPAROSCOPY     ectopic  . DILATION AND CURETTAGE OF UTERUS    . MYOMECTOMY    . ROBOTIC ASSISTED TOTAL HYSTERECTOMY WITH SALPINGECTOMY Bilateral 06/01/2016   Procedure: ROBOTIC ASSISTED TOTAL HYSTERECTOMY WITH BILATERAL SALPINGECTOMY;  Surgeon: Servando Salina, MD;  Location: [facility];  Service: Gynecology;  Laterality: Bilateral;    Her Family History Is Significant For: Family History   Problem Relation Age of Onset  . Hypertension Mother   . Diabetes Mother   . Migraines Mother   . Graves' disease Mother   . Hypertension Father   . Breast cancer Sister   . Hypertension Sister   . Migraines Sister     Her Social History Is Significant For: Social History   Social History  . Marital status: Divorced    Spouse name: N/A  . Number of children: 2  . Years of education: Grad   Occupational History  . [employer]   Social History Main Topics  . Smoking status: Never Smoker  . Smokeless tobacco: Never Used  . Alcohol use Yes     Comment: occ  . Drug use: No  . Sexual activity: Not Asked   Other Topics Concern  . None   Social History Narrative   Drinks 1 cup of coffee a day     Her Allergies Are:  Allergies  Allergen Reactions  . Contrast Media [Iodinated Diagnostic Agents] Nausea Only and Other (See Comments)    Caused burning.  . Gadolinium Derivatives Nausea Only and Other (See Comments)    CAUSED BURNING  . Levaquin [Levofloxacin In D5w] Other (See Comments)    "inflammation" burning sensation, generalized  . Other Swelling    Walnuts causes swelling of the tongue.  Lavella Lemons [Benzonatate] Swelling  :   Her Current Medications Are:  Outpatient Encounter Prescriptions as of 01/24/2017  Medication Sig  . Biotin 5000 MCG CAPS Take 1 capsule by mouth daily.  . butalbital-acetaminophen-caffeine (FIORICET) 50-325-40 MG tablet Take 0.5-1 tablets by mouth 2 (two) times daily as needed for headache or migraine.  . topiramate (TOPAMAX) 25 MG tablet Take 25-50 mg by mouth See admin instructions. Takes 1 tab in am and 2 tabs in pm   No facility-administered encounter medications on file as of 01/24/2017.   :  Review of Systems:  Out of a complete 14 point review of systems, all are reviewed and negative with the exception of these symptoms as listed below: Review of Systems  Neurological:       Patient never had a sleep study  before.   Patient has trouble falling and staying asleep, wakes up choking, wakes up feeling tired, morning headaches, daytime fatigue, takes naps on lunch break at work.    Epworth Sleepiness Scale 0= would never doze 1= slight chance of dozing 2= moderate chance of dozing 3= high chance of dozing  Sitting and reading:2 Watching TV:3 Sitting inactive in a public place (ex. Theater or meeting):1 As a passenger in a car for an hour without a break:1 Lying down to rest in the afternoon:3 Sitting and talking to someone:0 Sitting quietly after lunch (no alcohol):2 In a car, while stopped in traffic:0 Total:12  Objective:  Neurologic Exam  Physical Exam Physical Examination:   Vitals:   01/24/17 1519  BP: 122/80  Pulse: 74  Resp: 14    General Examination: The patient is a very pleasant 46 y.o. female in no acute distress. She appears well-developed and well-nourished and well  groomed.   HEENT: Normocephalic, atraumatic, pupils are equal, round and reactive to light and accommodation. Funduscopic exam is normal with sharp disc margins noted. Extraocular tracking is good without limitation to gaze excursion or nystagmus noted. Normal smooth pursuit is noted. Hearing is grossly intact. Face is symmetric with normal facial animation and normal facial sensation. Speech is clear with no dysarthria noted. There is no hypophonia. There is no lip, neck/head, jaw or voice tremor. Neck is supple with full range of passive and active motion. There are no carotid bruits on auscultation. Oropharynx exam reveals: mild mouth dryness, good dental hygiene and mild airway crowding, due to smaller airway entry and tonsils in place. Mallampati is class II. Tongue protrudes centrally and palate elevates symmetrically. Tonsils are 1+ in size. Neck size is 12 5/8 inches. She has a Mild overbite. Nasal inspection reveals no significant nasal mucosal bogginess or redness and no septal deviation.   Chest:  Clear to auscultation without wheezing, rhonchi or crackles noted.  Heart: S1+S2+0, regular and normal without murmurs, rubs or gallops noted.   Abdomen: Soft, non-tender and non-distended with normal bowel sounds appreciated on auscultation.  Extremities: There is no pitting edema in the distal lower extremities bilaterally. Pedal pulses are intact.  Skin: Warm and dry without trophic changes noted.  Musculoskeletal: exam reveals no obvious joint deformities, tenderness or joint swelling or erythema.   Neurologically:  Mental status: The patient is awake, alert and oriented in all 4 spheres. Her immediate and remote memory, attention, language skills and fund of knowledge are appropriate. There is no evidence of aphasia, agnosia, apraxia or anomia. Speech is clear with normal prosody and enunciation. Thought process is linear. Mood is normal and affect is normal.  Cranial nerves II - XII are as described above under HEENT exam. In addition: shoulder shrug is normal with equal shoulder height noted. Motor exam: Normal bulk, strength and tone is noted. There is no drift, tremor or rebound. Romberg is negative. Reflexes are 2+ throughout. Babinski: Toes are flexor bilaterally. Fine motor skills and coordination: intact with normal finger taps, normal hand movements, normal rapid alternating patting, normal foot taps and normal foot agility.  Cerebellar testing: No dysmetria or intention tremor on finger to nose testing. Heel to shin is unremarkable bilaterally. There is no truncal or gait ataxia.  Sensory exam: intact to light touch in the upper and lower extremities.  Gait, station and balance: She stands easily. No veering to one side is noted. No leaning to one side is noted. Posture is age-appropriate and stance is narrow based. Gait shows normal stride length and normal pace. No problems turning are noted. Tandem walk is unremarkable.   Assessment and Plan:  In summary, THERESA WEDEL  is a very pleasant 46 y.o.-year old female with an underlying medical history of migraine headaches (has seen Dr. Jannifer Franklin before), hypertension, heart murmur, recent admission to the hospital for chest pain and vertigo, whose history and physical exam are concerning for obstructive sleep apnea (OSA); she also reports a FHx of OSA. I had a long chat with the patient about my findings and the diagnosis of OSA, its prognosis and treatment options. We talked about medical treatments, surgical interventions and non-pharmacological approaches. I explained in particular the risks and ramifications of untreated moderate to severe OSA, especially with respect to developing cardiovascular disease down the Road, including congestive heart failure, difficult to treat hypertension, cardiac arrhythmias, or stroke. Even type 2 diabetes has, in part, been linked  to untreated OSA. Symptoms of untreated OSA include daytime sleepiness, memory problems, mood irritability and mood disorder such as depression and anxiety, lack of energy, as well as recurrent headaches, especially morning headaches. We talked about trying to maintain a healthy lifestyle in general, as well as the importance of weight control. I encouraged the patient to eat healthy, exercise daily and keep well hydrated, to keep a scheduled bedtime and wake time routine, to not skip any meals and eat healthy snacks in between meals. I advised the patient not to drive when feeling sleepy. I recommended the following at this time: sleep study with potential positive airway pressure titration. (We will score hypopneas at 4%).   I explained the sleep test procedure to the patient and also outlined possible surgical and non-surgical treatment options of OSA, including the use of a custom-made dental device (which would require a referral to a specialist dentist or oral surgeon), upper airway surgical options, such as pillar implants, radiofrequency surgery, tongue base  surgery, and UPPP (which would involve a referral to an ENT surgeon). Rarely, jaw surgery such as mandibular advancement may be considered.  I also explained the CPAP treatment option to the patient, who indicated that she would be willing to try CPAP if the need arises. I explained the importance of being compliant with PAP treatment, not only for insurance purposes but primarily to improve Her symptoms, and for the patient's long term health benefit, including to reduce Her cardiovascular risks. I answered all her questions today and the patient was in agreement. I would like to see her back after the sleep study is completed and encouraged her to call with any interim questions, concerns, problems or updates.   Thank you very much for allowing me to participate in the care of this nice patient. If I can be of any further assistance to you please do not hesitate to call me at [phone].  Sincerely,   Star Age, MD, PhD

## 2017-02-07 ENCOUNTER — Telehealth: Payer: Self-pay | Admitting: Neurology

## 2017-02-07 DIAGNOSIS — Z820 Family history of epilepsy and other diseases of the nervous system: Secondary | ICD-10-CM

## 2017-02-07 DIAGNOSIS — G478 Other sleep disorders: Secondary | ICD-10-CM

## 2017-02-07 DIAGNOSIS — R400 Somnolence: Secondary | ICD-10-CM

## 2017-02-07 DIAGNOSIS — R0689 Other abnormalities of breathing: Secondary | ICD-10-CM

## 2017-02-07 DIAGNOSIS — R519 Headache, unspecified: Secondary | ICD-10-CM

## 2017-02-07 DIAGNOSIS — R51 Headache: Secondary | ICD-10-CM

## 2017-02-07 DIAGNOSIS — G479 Sleep disorder, unspecified: Secondary | ICD-10-CM

## 2017-02-07 NOTE — Telephone Encounter (Signed)
[employer] denied Split.  Can I get an order for HST?

## 2017-02-08 NOTE — Telephone Encounter (Signed)
Order is in.

## 2017-02-19 ENCOUNTER — Ambulatory Visit (INDEPENDENT_AMBULATORY_CARE_PROVIDER_SITE_OTHER): Payer: 59 | Admitting: Neurology

## 2017-02-19 DIAGNOSIS — G4710 Hypersomnia, unspecified: Secondary | ICD-10-CM | POA: Diagnosis not present

## 2017-03-06 ENCOUNTER — Telehealth: Payer: Self-pay

## 2017-03-06 DIAGNOSIS — R0683 Snoring: Secondary | ICD-10-CM

## 2017-03-06 DIAGNOSIS — R519 Headache, unspecified: Secondary | ICD-10-CM

## 2017-03-06 DIAGNOSIS — R400 Somnolence: Secondary | ICD-10-CM

## 2017-03-06 DIAGNOSIS — R51 Headache: Secondary | ICD-10-CM

## 2017-03-06 DIAGNOSIS — Z820 Family history of epilepsy and other diseases of the nervous system: Secondary | ICD-10-CM

## 2017-03-06 NOTE — Telephone Encounter (Signed)
-----   Message from Star Age, MD sent at 03/06/2017  8:16 AM EDT ----- Patient referred by Eldridge Abrahams, seen by me on 01/24/17, HST on 02/22/17.   Please call and notify the patient that the recent home sleep test did not show any significant obstructive sleep apnea. Patient can follow up with the referring provider. Please remind patient to try to maintain good sleep hygiene, which means: Keep a regular sleep and wake schedule, try not to exercise or have a meal within 2 hours of your bedtime, try to keep your bedroom conducive for sleep, that is, cool and dark, without light distractors such as an illuminated alarm clock, and refrain from watching TV right before sleep or in the middle of the night and do not keep the TV or radio on during the night. Also, try not to use or play on electronic devices at bedtime, such as your cell phone, tablet PC or laptop. If you like to read at bedtime on an electronic device, try to dim the background light as much as possible. Do not eat in the middle of the night.  A copy of the report will be sent to the patient, the PCP and referring MD, if other than PCP.  Once you have spoken to patient, you can close this encounter.   Thanks,  Star Age, MD, PhD [facility])

## 2017-03-06 NOTE — Telephone Encounter (Signed)
I LM for patient (per DPR) with results and recommendations below. I sent copy of report to PCP. Left call back number for any further questions.

## 2017-03-06 NOTE — Progress Notes (Signed)
Patient referred by Eldridge Abrahams, seen by me on 01/24/17, HST on 02/22/17.   Please call and notify the patient that the recent home sleep test did not show any significant obstructive sleep apnea. Patient can follow up with the referring provider. Please remind patient to try to maintain good sleep hygiene, which means: Keep a regular sleep and wake schedule, try not to exercise or have a meal within 2 hours of your bedtime, try to keep your bedroom conducive for sleep, that is, cool and dark, without light distractors such as an illuminated alarm clock, and refrain from watching TV right before sleep or in the middle of the night and do not keep the TV or radio on during the night. Also, try not to use or play on electronic devices at bedtime, such as your cell phone, tablet PC or laptop. If you like to read at bedtime on an electronic device, try to dim the background light as much as possible. Do not eat in the middle of the night.  A copy of the report will be sent to the patient, the PCP and referring MD, if other than PCP.  Once you have spoken to patient, you can close this encounter.   Thanks,  Star Age, MD, PhD [facility])

## 2017-03-06 NOTE — Procedures (Signed)
[facility] [address] [place], [place] [address]  NAME: Kelli Tran DOB: 12/01/1970 MEDICAL RECORD [id number]  DOS: 02/26/18 REFERRING PHYSICIAN: Darrelyn Hillock Study Performed:  HST/Out of Center Sleep Test  HISTORY: 46 year old woman with a history of migraine headaches, hypertension, heart murmur, recent admission to the hospital for chest pain and vertigo, who c/o morning headaches, choking sensation at night, and excessive daytime somnolence. Her Epworth sleepiness score is 12 out of 24, her fatigue score is 37 out of 63. BMI is 24.5.  STUDY RESULTS:  Total Recording Time: 7h 53m  Total Apnea/Hypopnea Index (AHI): 3.0/hour  Average Oxygen Saturation: 94%  Lowest Oxygen Saturation: 87%  Average Mean Heart Rate:   70 bpm  IMPRESSION: Hypersomnia, unspecified  RECOMMENDATION: This home sleep test does not demonstrate any significant obstructive or central sleep disordered breathing; some intermittent snoring was noted. Other causes of the patient's symptoms, including circadian rhythm disturbances, an underlying mood disorder, medication effect and/or an underlying medical problem cannot be ruled out based on this test. Clinical correlation is recommended. The patient and her referring provider will be notified of the test results; a follow up appointment will be made as necessary.   I certify that I have reviewed the raw data recording prior to the issuance of this report in accordance with the standards of Accreditation of the American Academy of Sleep medicine (AASM).   Star Age, MD, PhD Diplomat, ABPN (Neurology and Sleep)

## 2017-03-07 NOTE — Telephone Encounter (Signed)
Patient called back with concerns, she feels that this is not an accurate test because she did not sleep well during the night of the study. Can we attempt this again? Or see if insurance will cover attended test?  Patient reports that she wakes up choking at night and feels like she stops breathing in her sleep.

## 2017-03-07 NOTE — Telephone Encounter (Signed)
Kelli Tran: Can we look into getting an approval for split/attended sleep study?  Clinical concern and Sx in keeping with OSA in the context of neg HST, should get [place] the auth.

## 2017-03-08 NOTE — Telephone Encounter (Signed)
Got the approval. Will call and get her scheduled

## 2017-03-08 NOTE — Addendum Note (Signed)
Addended by: Star Age on: 03/08/2017 01:35 PM   Modules accepted: Orders

## 2017-03-08 NOTE — Telephone Encounter (Signed)
Split night sleep study order placed.

## 2017-03-25 ENCOUNTER — Ambulatory Visit (INDEPENDENT_AMBULATORY_CARE_PROVIDER_SITE_OTHER): Payer: 59 | Admitting: Neurology

## 2017-03-25 DIAGNOSIS — G4710 Hypersomnia, unspecified: Secondary | ICD-10-CM | POA: Diagnosis not present

## 2017-03-25 DIAGNOSIS — R0683 Snoring: Secondary | ICD-10-CM

## 2017-03-25 DIAGNOSIS — G4720 Circadian rhythm sleep disorder, unspecified type: Secondary | ICD-10-CM

## 2017-03-29 DIAGNOSIS — R0683 Snoring: Secondary | ICD-10-CM | POA: Insufficient documentation

## 2017-03-29 NOTE — Progress Notes (Signed)
Patient referred by Eldridge Abrahams, NP, seen by me on 01/24/17, had neg HST for OSA, then diagnostic PSG on 03/25/17.   Please call and notify the patient that the recent sleep study did not show any significant obstructive sleep apnea. Please inform patient that she can FU with [facility]. Also, route or fax report to Ms. Ronnald Ramp, once you have spoken to patient, you can close this encounter.   Thanks,  Star Age, MD, PhD [facility])

## 2017-03-29 NOTE — Procedures (Signed)
PATIENT'S NAME:  Kelli Tran, Kelli Tran DOB:      1971-02-05      MR#:    [id number]     DATE OF RECORDING: 03/25/2017 REFERRING M.D.:  Eldridge Abrahams, NP Study Performed:   Baseline Polysomnogram HISTORY: 46 year old woman with a history of migraine headaches, hypertension, heart murmur, recent admission to the hospital for chest pain and vertigo, who c/o AH AHs, choking sensation at night, and excessive daytime somnolence. The patient endorsed the Epworth Sleepiness Scale at 12/24 points. The patient's weight 130 pounds with a height of 61 (inches), resulting in a BMI of 24.6 kg/m2. The patient's neck circumference measured 12.7 inches.  CURRENT MEDICATIONS: Biotin, Butalbital-Acetaminophen and Topiramate   PROCEDURE:  This is a multichannel digital polysomnogram utilizing the Somnostar 11.2 system.  Electrodes and sensors were applied and monitored per AASM Specifications.   EEG, EOG, Chin and Limb EMG, were sampled at 200 Hz.  ECG, Snore and Nasal Pressure, Thermal Airflow, Respiratory Effort, CPAP Flow and Pressure, Oximetry was sampled at 50 Hz. Digital video and audio were recorded.      BASELINE STUDY  Lights Out was at 21:20 and Lights On at 05:16.  Total recording time (TRT) was 477 minutes, with a total sleep time (TST) of  430.5 minutes.   The patient's sleep latency was 12 minutes, which is normal, REM latency was 66 minutes, which is marginally reduced. The sleep efficiency was 90.3 %, which is normal.    SLEEP ARCHITECTURE: WASO (Wake after sleep onset) was 40 minutes with mild sleep fragmentation noted. There were 24 minutes in Stage N1, 324.5 minutes Stage N2, 17.5 minutes Stage N3 and 64.5 minutes in Stage REM.  The percentage of Stage N1 was 5.6%, Stage N2 was 75.4%, which is increased, Stage N3 was 4.1%, which is reduced and Stage R (REM sleep) was 15.%, which is mildly reduced.  The arousals were noted as: 72 were spontaneous, 0 were associated with PLMs, 11 were associated with  respiratory events.    Audio and video analysis did not show any abnormal or unusual movements, behaviors, phonations or vocalizations.  The patient took 2 bathroom breaks. Mild intermittent snoring was noted. The EKG was in keeping with normal sinus rhythm (NSR).  RESPIRATORY ANALYSIS:  There were a total of 11 respiratory events:  0 obstructive apneas, 0 central apneas and 0 mixed apneas with a total of 0 apneas and an apnea index (AI) of 0 /hour. There were 11 hypopneas with a hypopnea index of 1.5 /hour. The patient also had 0 respiratory event related arousals (RERAs).      The total APNEA/HYPOPNEA INDEX (AHI) was 1.5/hour and the total RESPIRATORY DISTURBANCE INDEX was 1.5 /hour.  1 events occurred in REM sleep and 20 events in NREM. The REM AHI was .9 /hour, versus a non-REM AHI of 1.6. The patient spent 153 minutes of total sleep time in the supine position and 278 minutes in non-supine.. The supine AHI was 0.8 versus a non-supine AHI of 1.9.  OXYGEN SATURATION & C02:  The Wake baseline 02 saturation was 96%, with the lowest being 83%. Time spent below 89% saturation equaled 15 minutes. Of note, this is likely an overestimation as the pulse oximeter probe came off a few times with faulty readings subsequently.   PERIODIC LIMB MOVEMENTS: The patient had a total of 0 Periodic Limb Movements.  The Periodic Limb Movement (PLM) index was 0 and the PLM Arousal index was 0/hour.  Post-study, the patient indicated that  sleep was the same as usual.   IMPRESSION:  1. Primary Snoring 2. Dysfunctions associated with sleep stages or arousal from sleep  RECOMMENDATIONS:  1. This study does not demonstrate any significant obstructive or central sleep disordered breathing and only mild intermittent snoring. This study does not support an intrinsic sleep disorder as a cause of the patient's symptoms. Other causes, including circadian rhythm disturbances, an underlying mood disorder, medication effect  and/or an underlying medical problem cannot be ruled out. 2. This study shows mild sleep fragmentation and abnormal sleep stage percentages; these are nonspecific findings and per se do not signify an intrinsic sleep disorder or a cause for the patient's sleep-related symptoms. Causes include (but are not limited to) the first night effect of the sleep study, circadian rhythm disturbances, medication effect or an underlying mood disorder or medical problem.  3. The patient should be cautioned not to drive, work at heights, or operate dangerous or heavy equipment when tired or sleepy. Review and reiteration of good sleep hygiene measures should be pursued with any patient. 4. The patient can follow up with her referring provider, who will be notified of the test results.    I certify that I have reviewed the entire raw data recording prior to the issuance of this report in accordance with the Standards of Accreditation of the American Academy of Sleep Medicine (AASM)    Star Age, MD, PhD Diplomat, American Board of Psychiatry and Neurology (Neurology and Sleep Medicine)

## 2017-04-02 ENCOUNTER — Telehealth: Payer: Self-pay

## 2017-04-02 NOTE — Telephone Encounter (Signed)
Opened in error

## 2017-04-02 NOTE — Telephone Encounter (Signed)
LM (per DPR) with results and recommendations. I have sent results to PCP.

## 2017-04-02 NOTE — Telephone Encounter (Signed)
-----   Message from Star Age, MD sent at 03/29/2017  7:48 AM EDT ----- Patient referred by Eldridge Abrahams, NP, seen by me on 01/24/17, had neg HST for OSA, then diagnostic PSG on 03/25/17.   Please call and notify the patient that the recent sleep study did not show any significant obstructive sleep apnea. Please inform patient that she can FU with [facility]. Also, route or fax report to Ms. Ronnald Ramp, once you have spoken to patient, you can close this encounter.   Thanks,  Star Age, MD, PhD [facility])

## 2017-07-15 IMAGING — MR MR HEAD W/O CM
9 of 11 series · 32 of 48 positions shown · non-contrast
Comparison: Head CT without contrast 6320 hours today and earlier.

CLINICAL DATA: 45-year-old female with chest pain, dizziness,
headache, double vision, diaphoresis. Initial encounter.

EXAM:
MRI HEAD WITHOUT CONTRAST
TECHNIQUE: Multiplanar, multiecho pulse sequences of the brain and surrounding
structures were obtained without intravenous contrast.

[Series 3: DWI · axial · 3.0mm · 0.94mm/px · z∈[-119,+11]mm · 6 of 97 slices shown (1 of 2)]
[im 1/97]
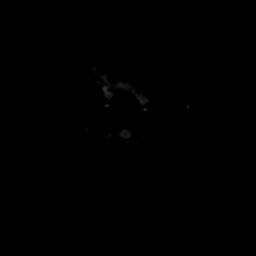
[im 20/97]
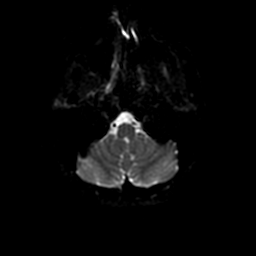
[im 39/97]
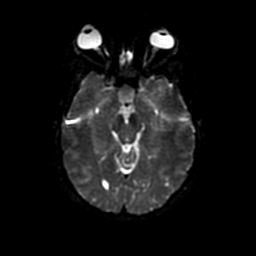
[im 58/97]
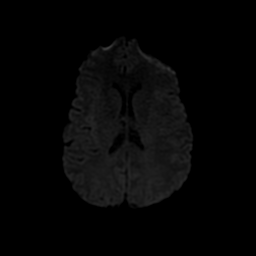
[im 77/97]
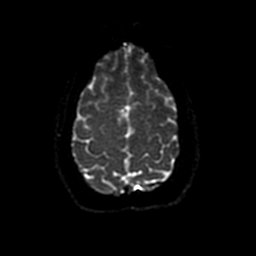
[im 97/97]
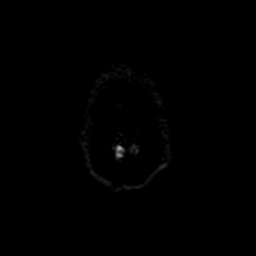

[Series 4: FLAIR · sagittal · 5.0mm · 0.47mm/px · 1 of 21 slices shown (1 of 2)]
[im 1/21]
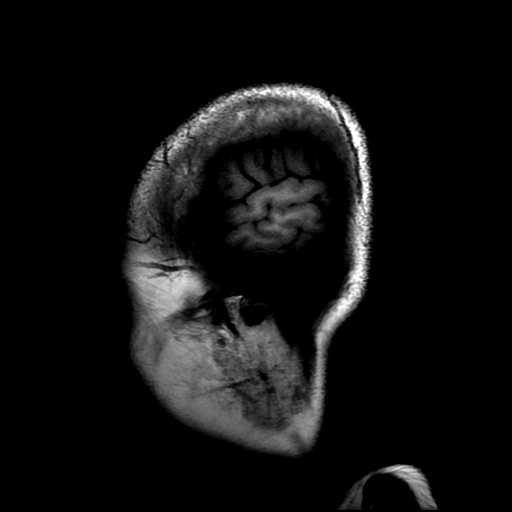

[Series 6: T2 · axial · 5.0mm · 0.47mm/px · z∈[-107,+24]mm · 2 of 25 slices shown (1 of 2)]
[im 1/25]
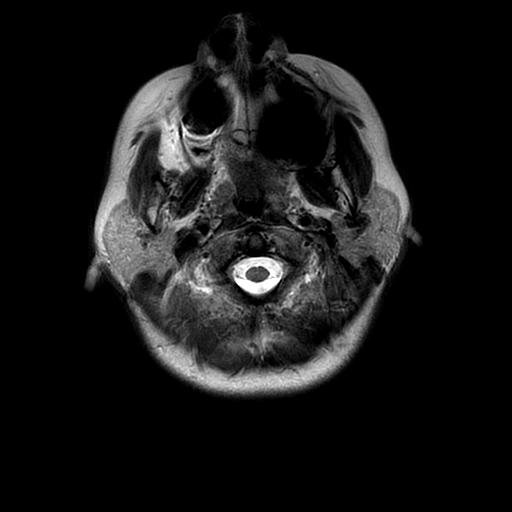
[im 25/25]
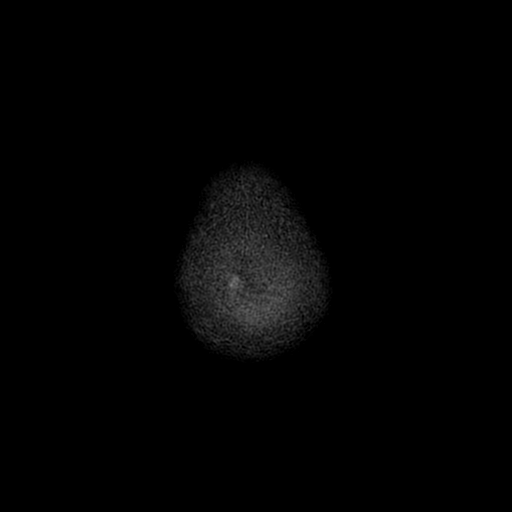

[Series 7: FLAIR · axial · 5.0mm · 0.47mm/px · z∈[-107,+24]mm · 2 of 25 slices shown (2 of 2)]
[im 1/25]
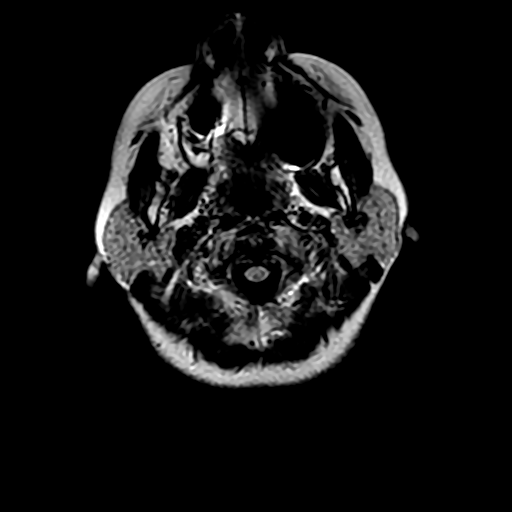
[im 25/25]
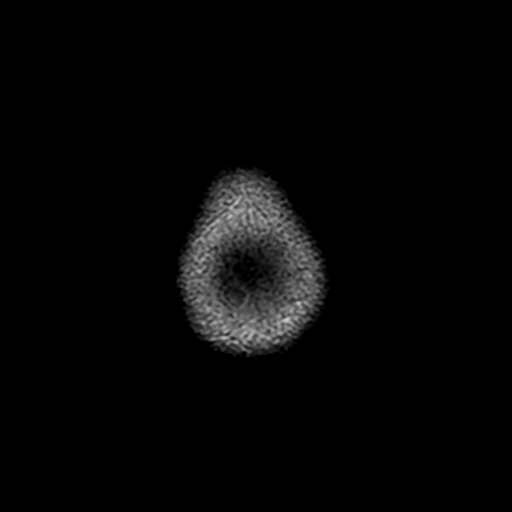

[Series 8: (person_name) · axial · 3.0mm · 0.47mm/px · z∈[-108,-13]mm · 6 of 100 slices shown]
[im 1/100]
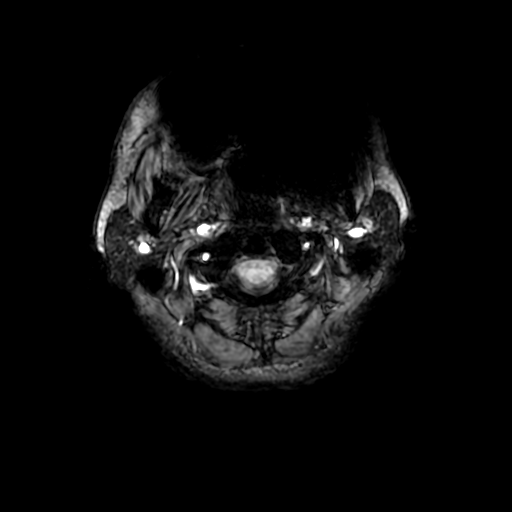
[im 15/100]
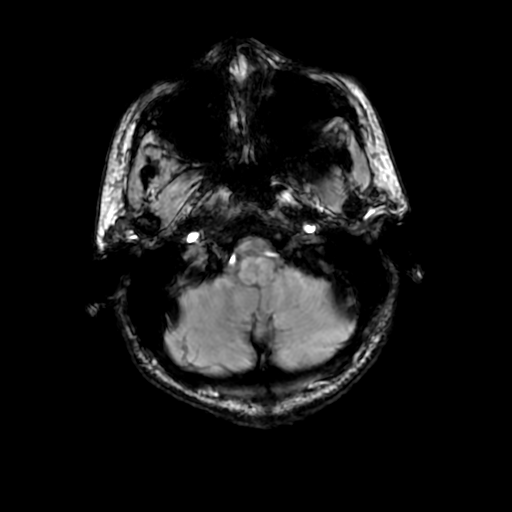
[im 29/100]
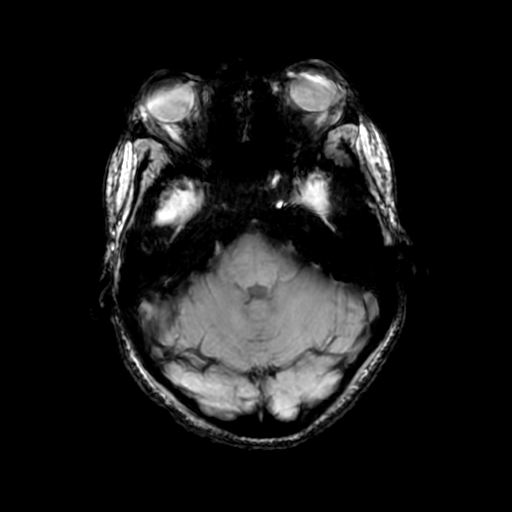
[im 43/100]
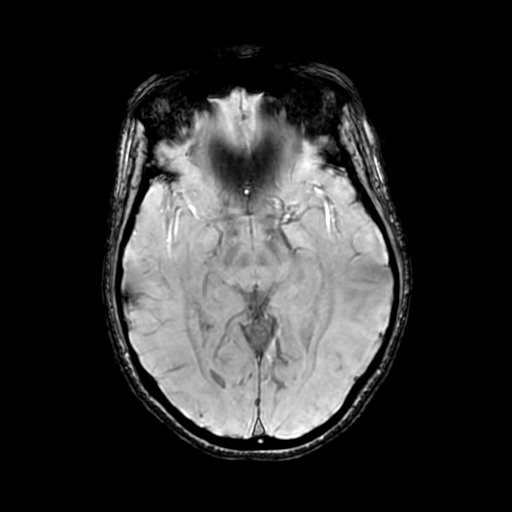
[im 57/100]
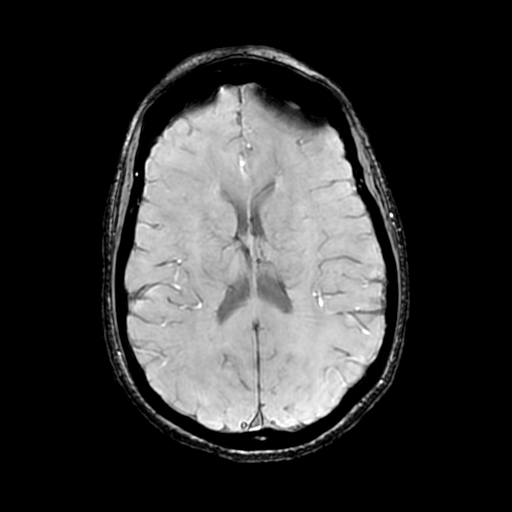
[im 71/100]
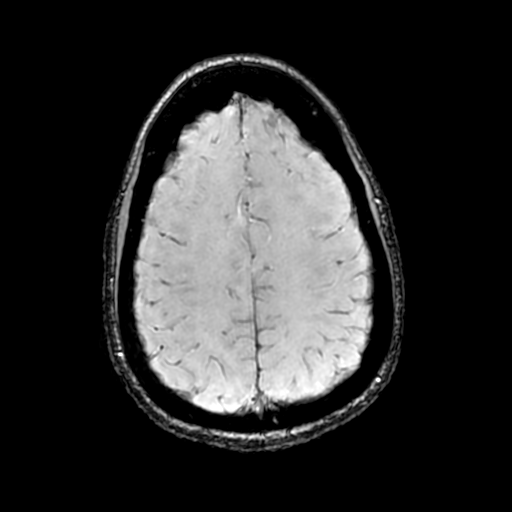

[Series 10: DWI · coronal · 4.0mm · 0.94mm/px · 6 of 72 slices shown (2 of 2)]
[im 1/72]
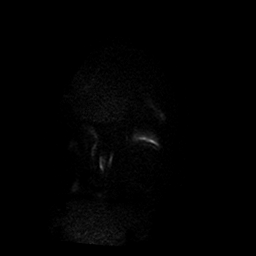
[im 15/72]
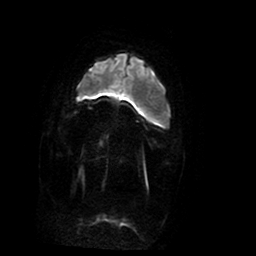
[im 29/72]
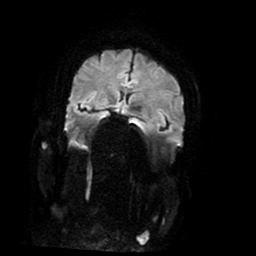
[im 43/72]
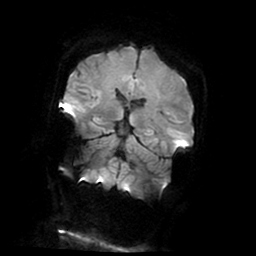
[im 57/72]
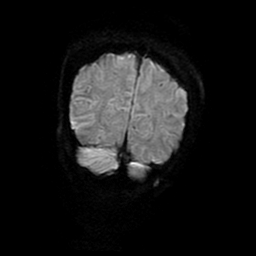
[im 72/72]
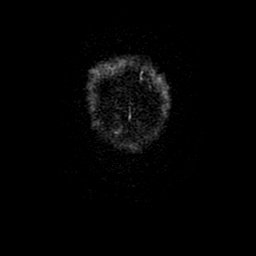

[Series 11: T2 · coronal · 5.0mm · 0.47mm/px · 2 of 27 slices shown (2 of 2)]
[im 1/27]
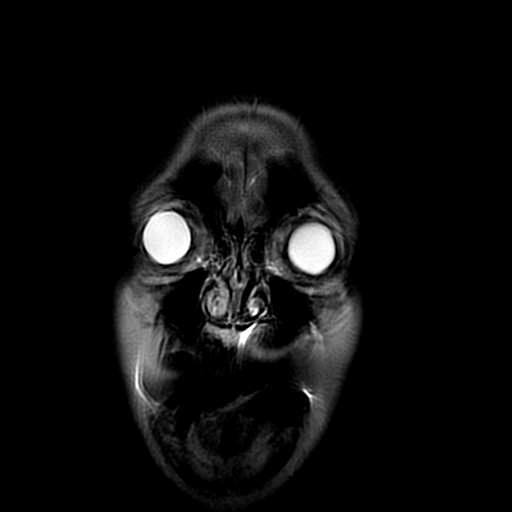
[im 27/27]
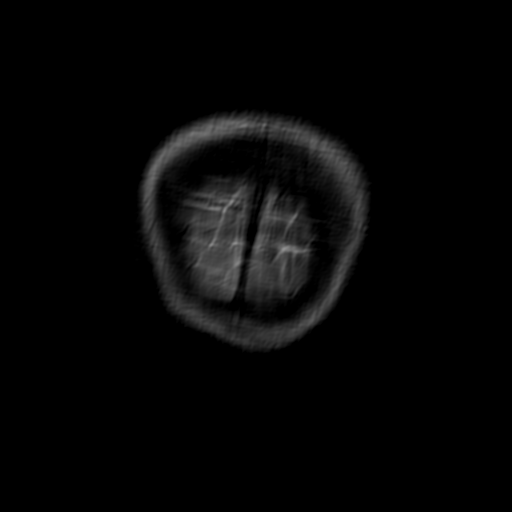

[Series 350: ADC · axial · 3.0mm · 0.94mm/px · z∈[-119,+11]mm · 4 of 49 slices shown (1 of 2)]
[im 1/49]
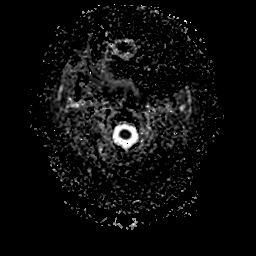
[im 17/49]
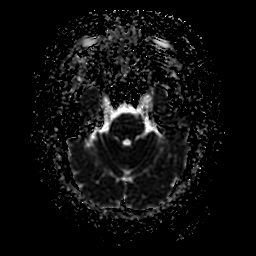
[im 33/49]
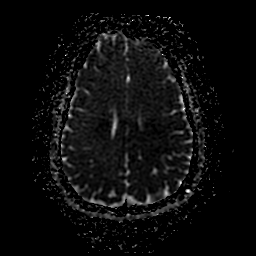
[im 49/49]
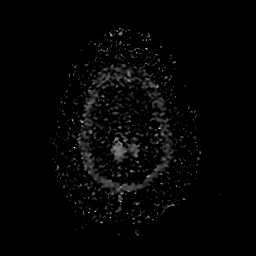

[Series 1050: ADC · coronal · 4.0mm · 0.94mm/px · 3 of 36 slices shown (2 of 2)]
[im 1/36]
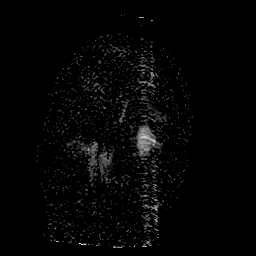
[im 18/36]
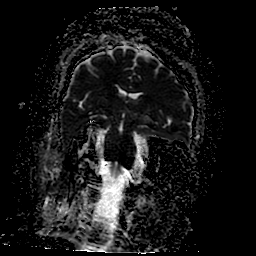
[im 36/36]
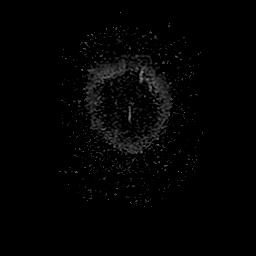

[32 of 48 positions shown; findings below may reference images not displayed]

FINDINGS: Brain: Cerebral volume is normal. No restricted diffusion to suggest
acute infarction. No midline shift, mass effect, evidence of mass
lesion, ventriculomegaly, extra-axial collection or acute
intracranial hemorrhage. Cervicomedullary junction and pituitary are
within normal limits. Gray and white matter signal is within normal
limits throughout the brain. No definite chronic cerebral blood
products. There is a punctate calcification along the right
hippocampal gyrus which is stable and nonspecific. No
encephalomalacia identified.

Vascular: Major intracranial vascular flow voids are preserved and
appear normal. The distal right vertebral artery appears mildly
dominant.

Skull and upper cervical spine: Negative. Normal bone marrow signal.

Sinuses/Orbits: Negative orbits soft tissues. Visualized paranasal
sinuses and mastoids are stable and well pneumatized.

Other: Grossly normal visible internal auditory structures. Negative
scalp soft tissues.
IMPRESSION: Normal noncontrast MRI appearance of the brain.

## 2018-04-01 ENCOUNTER — Other Ambulatory Visit: Payer: Self-pay

## 2018-04-01 ENCOUNTER — Encounter (HOSPITAL_COMMUNITY): Payer: Self-pay | Admitting: Emergency Medicine

## 2018-04-01 ENCOUNTER — Ambulatory Visit (HOSPITAL_COMMUNITY)
Admission: EM | Admit: 2018-04-01 | Discharge: 2018-04-01 | Disposition: A | Discharge status: Home / Self Care | Payer: BLUE CROSS/BLUE SHIELD | Attending: Internal Medicine | Admitting: Internal Medicine

## 2018-04-01 DIAGNOSIS — M5412 Radiculopathy, cervical region: Secondary | ICD-10-CM

## 2018-04-01 DIAGNOSIS — M436 Torticollis: Secondary | ICD-10-CM | POA: Diagnosis not present

## 2018-04-01 MED ORDER — PREDNISONE 50 MG PO TABS: 50.0000 mg | ORAL_TABLET | Freq: Every day | ORAL | 0 refills | Status: AC

## 2018-04-01 MED ORDER — CYCLOBENZAPRINE HCL 5 MG PO TABS
5.0000 mg | ORAL_TABLET | Freq: Every day | ORAL | 0 refills | Status: DC
Start: 2018-04-01 — End: 2020-01-13

## 2018-04-01 MED ORDER — MELOXICAM 7.5 MG PO TABS: 7.5000 mg | ORAL_TABLET | Freq: Every day | ORAL | 0 refills | Status: DC

## 2018-04-01 MED ORDER — KETOROLAC TROMETHAMINE 60 MG/2ML IM SOLN
60.0000 mg | Freq: Once | INTRAMUSCULAR | Status: AC
  Administered 2018-04-01: 60 mg via INTRAMUSCULAR

## 2018-04-01 MED ORDER — KETOROLAC TROMETHAMINE 60 MG/2ML IM SOLN
INTRAMUSCULAR | Status: AC
  Filled 2018-04-01: qty 2

## 2018-04-01 NOTE — Discharge Instructions (Addendum)
See exercises, perform as able.  Complete course of prednisone. Daily meloxicam, you may start this tomorrow and take while on prednisone, please take with food.  May use daily zyrtec to help with congestion. Flexeril at night as needed for muscle spasms, may cause drowsiness so do not take if drinking alcohol or driving. Heat application and massage may be helpful.

## 2018-04-01 NOTE — ED Provider Notes (Signed)
[place]    CSN: [id number] Arrival date & time: 04/01/18  1414     History   Chief Complaint Chief Complaint  Patient presents with  . Neck Pain    HPI Kelli Tran is a 47 y.o. female.   Farran presents with complaints of neck pain and upper shoulder pain which started 6 days ago, gradual onset while at work- she works on a Teaching laboratory technician at [employer]. Pain with movement of the neck. If she flexes her neck pain radiates down spine. Intermittent shooting pain to bilateral arms. without numbness, tingling or weakness. Also with slight congestion and voice hoarseness which developed two days ago and has been improving. Denies any previous similar. No known injury. Worse at night. Tried baclofen, lidocaine patches and creams which she had previously from history of low back pain. Also tried tylenol and naproxen which did not help. Has not taken any medications today. Hx of htn, murmur, migraines.     ROS per HPI.      Past Medical History:  Diagnosis Date  . Heart murmur    childhood  . Hypertension   . Migraine   . Migraine without aura, with intractable migraine, so stated, without mention of status migrainosus 04/09/2014  . Other and unspecified ovarian cysts     Patient Active Problem List   Diagnosis Date Noted  . Primary snoring 03/29/2017  . Chest pain 12/29/2016  . History of essential hypertension 12/29/2016  . Elevated troponin 12/29/2016  . S/P hysterectomy 06/01/2016  . Migraine without aura, with intractable migraine, so stated, without mention of status migrainosus 04/09/2014    Past Surgical History:  Procedure Laterality Date  . BREAST SURGERY    . DIAGNOSTIC LAPAROSCOPY     ectopic  . DILATION AND CURETTAGE OF UTERUS    . MYOMECTOMY    . ROBOTIC ASSISTED TOTAL HYSTERECTOMY WITH SALPINGECTOMY Bilateral 06/01/2016   Procedure: ROBOTIC ASSISTED TOTAL HYSTERECTOMY WITH BILATERAL SALPINGECTOMY;  Surgeon: Servando Salina, MD;  Location: [facility];  Service: Gynecology;  Laterality: Bilateral;    OB History   None      Home Medications    Prior to Admission medications   Medication Sig Start Date End Date Taking? Authorizing Provider  BACLOFEN EX Apply topically.   Yes [provider]  Multiple Vitamins-Minerals (MULTIVITAMIN ADULT PO) Take by mouth.   Yes [provider]  NON FORMULARY    Yes [provider]  Biotin 5000 MCG CAPS Take 1 capsule by mouth daily.    [provider]  butalbital-acetaminophen-caffeine (FIORICET) 50-325-40 MG tablet Take 0.5-1 tablets by mouth 2 (two) times daily as needed for headache or migraine.    [provider]  cyclobenzaprine (FLEXERIL) 5 MG tablet Take 1 tablet (5 mg total) by mouth at bedtime. 04/01/18   Zigmund Gottron, NP  meloxicam (MOBIC) 7.5 MG tablet Take 1 tablet (7.5 mg total) by mouth daily. 04/01/18   Zigmund Gottron, NP  predniSONE (DELTASONE) 50 MG tablet Take 1 tablet (50 mg total) by mouth daily with breakfast for 5 days. 04/01/18 04/06/18  Zigmund Gottron, NP    Family History Family History  Problem Relation Age of Onset  . Hypertension Mother   . Diabetes Mother   . Migraines Mother   . Graves' disease Mother   . Hypertension Father   . Breast cancer Sister   . Hypertension Sister   . Migraines Sister     Social History Social History  Tobacco Use  . Smoking status: Never Smoker  . Smokeless tobacco: Never Used  Substance Use Topics  . Alcohol use: Yes    Comment: occ  . Drug use: No     Allergies   Contrast media [iodinated diagnostic agents]; Gadolinium derivatives; Levaquin [levofloxacin in d5w]; Other; and Tessalon [benzonatate]   Review of Systems Review of Systems   Physical Exam Triage Vital Signs ED Triage Vitals  Enc Vitals Group     BP 04/01/18 1558 (!) 152/88     Pulse Rate 04/01/18 1558 74     Resp 04/01/18 1558 18     Temp 04/01/18 1558 98.4 F (36.9 C)     Temp Source 04/01/18  1558 Oral     SpO2 04/01/18 1558 98 %     Weight --      Height --      Head Circumference --      Peak Flow --      Pain Score 04/01/18 1553 9     Pain Loc --      Pain Edu? --      Excl. in [place]? --    No data found.  Updated Vital Signs BP (!) 152/88 (BP Location: Left Arm)   Pulse 74   Temp 98.4 F (36.9 C) (Oral)   Resp 18   LMP 05/31/2016   SpO2 98%   Visual Acuity Right Eye Distance:   Left Eye Distance:   Bilateral Distance:    Right Eye Near:   Left Eye Near:    Bilateral Near:     Physical Exam  Constitutional: She is oriented to person, place, and time. She appears well-developed and well-nourished. No distress.  Neck: Trachea normal. Muscular tenderness present. No spinous process tenderness present. Neck rigidity present. Decreased range of motion present. No edema and no erythema present.    Without specific bony tenderness on palpation. Spinous process pain with neck flexion that shoots down; without step off; extensive muscular pain to bilateral neck and trapezius with all active and passive ROM; strength equal to bilateral upper extremities and full ROM to shoulders noted; sensation intact  Cardiovascular: Normal rate, regular rhythm and normal heart sounds.  Pulmonary/Chest: Effort normal and breath sounds normal.  Neurological: She is alert and oriented to person, place, and time.  Skin: Skin is warm and dry.     UC Treatments / Results  Labs (all labs ordered are listed, but only abnormal results are displayed) Labs Reviewed - No data to display  EKG None  Radiology No results found.  Procedures Procedures (including critical care time)  Medications Ordered in UC Medications  ketorolac (TORADOL) injection 60 mg (has no administration in time range)    Initial Impression / Assessment and Plan / UC Course  I have reviewed the triage vital signs and the nursing notes.  Pertinent labs & imaging results that were available during my care  of the patient were reviewed by me and considered in my medical decision making (see chart for details).     Remains consistent with torticollis and muscle spasms, likely related to positioning sitting at desk at work. Heat, stretching, massage, muscle relaxer. 5 days of prednisone as well as meloxicam initiated. Return precautions provided. Patient verbalized understanding and agreeable to plan.    Final Clinical Impressions(s) / UC Diagnoses   Final diagnoses:  Cervical radiculopathy  Torticollis     Discharge Instructions     See exercises, perform as able.  Complete course  of prednisone. Daily meloxicam, you may start this tomorrow and take while on prednisone, please take with food.  May use daily zyrtec to help with congestion. Flexeril at night as needed for muscle spasms, may cause drowsiness so do not take if drinking alcohol or driving. Heat application and massage may be helpful.    ED Prescriptions    Medication Sig Dispense Auth. Provider   predniSONE (DELTASONE) 50 MG tablet Take 1 tablet (50 mg total) by mouth daily with breakfast for 5 days. 5 tablet Augusto Gamble B, NP   cyclobenzaprine (FLEXERIL) 5 MG tablet Take 1 tablet (5 mg total) by mouth at bedtime. 15 tablet Augusto Gamble B, NP   meloxicam (MOBIC) 7.5 MG tablet Take 1 tablet (7.5 mg total) by mouth daily. 20 tablet Zigmund Gottron, NP     Controlled Substance Prescriptions [place] Controlled Substance Registry consulted? Not Applicable   Zigmund Gottron, NP 04/01/18 1658

## 2018-04-01 NOTE — ED Triage Notes (Signed)
Noticed soreness in neck on Tuesday and pain increased since onset.  Pain in both sides of neck into bilateral shoulders.  If neck flexed, feels pain in the back of neck.  Sharp pain shoots down both arms.

## 2018-06-11 ENCOUNTER — Other Ambulatory Visit: Payer: Self-pay | Admitting: Family Medicine

## 2018-06-11 DIAGNOSIS — R200 Anesthesia of skin: Secondary | ICD-10-CM

## 2018-06-11 DIAGNOSIS — R519 Headache, unspecified: Secondary | ICD-10-CM

## 2018-06-11 DIAGNOSIS — R51 Headache: Principal | ICD-10-CM

## 2018-06-12 ENCOUNTER — Ambulatory Visit
Admission: RE | Admit: 2018-06-12 | Discharge: 2018-06-12 | Disposition: A | Discharge status: Home / Self Care | Payer: BLUE CROSS/BLUE SHIELD | Source: Ambulatory Visit | Attending: Family Medicine | Admitting: Family Medicine

## 2018-06-12 DIAGNOSIS — R519 Headache, unspecified: Secondary | ICD-10-CM

## 2018-06-12 DIAGNOSIS — R51 Headache: Principal | ICD-10-CM

## 2018-06-12 DIAGNOSIS — R200 Anesthesia of skin: Secondary | ICD-10-CM

## 2018-06-12 MED ORDER — GADOBENATE DIMEGLUMINE 529 MG/ML IV SOLN
12.0000 mL | Freq: Once | INTRAVENOUS | Status: AC | PRN
  Administered 2018-06-12: 12 mL via INTRAVENOUS

## 2018-07-08 ENCOUNTER — Encounter: Payer: Self-pay | Admitting: Physical Therapy

## 2018-07-08 ENCOUNTER — Ambulatory Visit: Payer: BLUE CROSS/BLUE SHIELD | Attending: Nurse Practitioner | Admitting: Physical Therapy

## 2018-07-08 ENCOUNTER — Other Ambulatory Visit: Payer: Self-pay

## 2018-07-08 DIAGNOSIS — R252 Cramp and spasm: Secondary | ICD-10-CM | POA: Diagnosis present

## 2018-07-08 DIAGNOSIS — M542 Cervicalgia: Secondary | ICD-10-CM | POA: Diagnosis present

## 2018-07-08 DIAGNOSIS — R51 Headache: Secondary | ICD-10-CM | POA: Insufficient documentation

## 2018-07-08 DIAGNOSIS — R519 Headache, unspecified: Secondary | ICD-10-CM

## 2018-07-08 NOTE — Therapy (Signed)
[place] [address] [place], [place], [address] Phone: [phone]   Fax:  [phone]  Physical Therapy Evaluation  Patient Details  Name: Kelli Tran MRN: [id number] Date of Birth: 10-Apr-1971 Referring Provider: Eldridge Abrahams   Encounter Date: 07/08/2018  PT End of Session - 07/08/18 1550    Visit Number  1    Date for PT Re-Evaluation  09/07/18    PT Start Time  1525    PT Stop Time  1619    PT Time Calculation (min)  54 min    Activity Tolerance  Patient tolerated treatment well    Behavior During Therapy  [facility] for tasks assessed/performed       Past Medical History:  Diagnosis Date  . Heart murmur    childhood  . Hypertension   . Migraine   . Migraine without aura, with intractable migraine, so stated, without mention of status migrainosus 04/09/2014  . Other and unspecified ovarian cysts     Past Surgical History:  Procedure Laterality Date  . BREAST SURGERY    . DIAGNOSTIC LAPAROSCOPY     ectopic  . DILATION AND CURETTAGE OF UTERUS    . MYOMECTOMY    . ROBOTIC ASSISTED TOTAL HYSTERECTOMY WITH SALPINGECTOMY Bilateral 06/01/2016   Procedure: ROBOTIC ASSISTED TOTAL HYSTERECTOMY WITH BILATERAL SALPINGECTOMY;  Surgeon: Servando Salina, MD;  Location: [facility];  Service: Gynecology;  Laterality: Bilateral;    There were no vitals filed for this visit.   Subjective Assessment - 07/08/18 1529    Subjective  Patient reports that she has had migraines since she was 47, she reports that in July she had a change to having a constant HA with neck pain and some face pain.  She reports that her normal migraine would be 2x/month for 2 days at a time.  MRI of the brain and head was negative.    Patient Stated Goals  have less pain    Currently in Pain?  Yes    Pain Score  6     Pain Location  Head   neck pain   Pain Descriptors / Indicators  Aching;Sharp    Pain Type  Acute pain    Pain Radiating  Towards  denies    Pain Onset  More than a month ago    Pain Frequency  Constant    Aggravating Factors   light, motions, stress, driving will increase pain to a 10/10    Pain Relieving Factors  nothing really has helped, has tried pain meds and heat    Effect of Pain on Daily Activities  limits everything when it is hurting         [facility] PT Assessment - 07/08/18 0001      Assessment   Medical Diagnosis  HA, neck pain    Referring Provider  Eldridge Abrahams    Onset Date/Surgical Date  06/07/18    Hand Dominance  Right    Prior Therapy  no      Precautions   Precautions  None      Balance Screen   Has the patient fallen in the past 6 months  No    Has the patient had a decrease in activity level because of a fear of falling?   No    Is the patient reluctant to leave their home because of a fear of falling?   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[employer], at computer all the time    Leisure  walks      Posture/Postural Control   Posture Comments  fwd head, rounded shoulders      ROM / Strength   AROM / PROM / Strength  AROM;Strength      AROM   Overall AROM Comments  Cervical ROM is decreased 50% with some pain in the neck area, shoulder ROM WNL's      Strength   Overall Strength Comments  4/5 with some c/o tendion in the neck and the upper traps      Palpation   Palpation comment  has significant spasms in the upper trpa, the cervical parapsinals, and into the rhomboid                Objective measurements completed on examination: See above findings.      [facility] Adult PT Treatment/Exercise - 07/08/18 0001      Modalities   Modalities  Electrical Stimulation;Moist Heat      Moist Heat Therapy   Number Minutes Moist Heat  15 Minutes    Moist Heat Location   Cervical      Electrical Stimulation   Electrical Stimulation Location  C/T area    Electrical Stimulation Action  IFC    Electrical Stimulation Parameters  supine    Electrical Stimulation Goals  Pain             PT Education - 07/08/18 1549    Education Details  HEP for cervical and scapular retraction, gentle upper trap and levator stretches    Person(s) Educated  Patient    Methods  Explanation;Demonstration;Verbal cues;Handout    Comprehension  Verbalized understanding       PT Short Term Goals - 07/08/18 1557      PT SHORT TERM GOAL #1   Title  independent with initial HEP    Time  2    Period  Weeks    Status  New        PT Long Term Goals - 07/08/18 1557      PT LONG TERM GOAL #1   Title  decrease pain 50%    Time  8    Period  Weeks    Status  New      PT LONG TERM GOAL #2   Title  decrease HA frequency by 50%    Time  8    Period  Weeks    Status  New      PT LONG TERM GOAL #3   Title  increase cervical ROM to WNL's    Time  8    Period  Weeks    Status  New      PT LONG TERM GOAL #4   Title  understand posture and body mechanics    Time  8    Period  Weeks    Status  New             Plan - 07/08/18 1551    Clinical Impression Statement  Patient reports that she has suffered from migraines since she was 47 years old they were 2x/month for 2 days at a time.  She reports that over teh past few months her HA's have changed  to a constant pain in the head and the neck.  She has significant spasms in the cervical area the upper traps and the rhomboids, she has limitaiton of cervical ROM    Clinical Presentation  Stable    Clinical Decision Making  Low    Rehab Potential  Good    PT Frequency  2x / week    PT Duration  8 weeks    PT Treatment/Interventions  ADLs/Self Care Home Management;Cryotherapy;Electrical Stimulation;Moist Heat;Traction;Ultrasound;Therapeutic exercise;Therapeutic activities;Patient/family education;Manual  techniques;Dry needling    PT Next Visit Plan  may try to start exercise, culd try STM, traction, she did not seem to be interested in dry needling    Consulted and Agree with Plan of Care  Patient       Patient will benefit from skilled therapeutic intervention in order to improve the following deficits and impairments:  Decreased range of motion, Increased muscle spasms, Pain, Improper body mechanics, Impaired flexibility, Decreased strength, Postural dysfunction  Visit Diagnosis: Acute intractable headache, unspecified headache type - Plan: PT plan of care cert/re-cert  Cramp and spasm - Plan: PT plan of care cert/re-cert  Cervicalgia - Plan: PT plan of care cert/re-cert     Problem List Patient Active Problem List   Diagnosis Date Noted  . Primary snoring 03/29/2017  . Chest pain 12/29/2016  . History of essential hypertension 12/29/2016  . Elevated troponin 12/29/2016  . S/P hysterectomy 06/01/2016  . Migraine without aura, with intractable migraine, so stated, without mention of status migrainosus 04/09/2014    Sumner Boast., PT 07/08/2018, 4:00 PM  [place] [address] [place], [place], [address] Phone: [phone]   Fax:  [phone]  Name: Kelli Tran MRN: [id number] Date of Birth: 11-Feb-1971

## 2018-07-17 ENCOUNTER — Ambulatory Visit: Payer: BLUE CROSS/BLUE SHIELD | Admitting: Physical Therapy

## 2018-07-18 ENCOUNTER — Encounter: Payer: Self-pay | Admitting: Physical Therapy

## 2018-07-18 ENCOUNTER — Ambulatory Visit: Payer: BLUE CROSS/BLUE SHIELD | Attending: Nurse Practitioner | Admitting: Physical Therapy

## 2018-07-18 DIAGNOSIS — R252 Cramp and spasm: Secondary | ICD-10-CM | POA: Diagnosis present

## 2018-07-18 DIAGNOSIS — M542 Cervicalgia: Secondary | ICD-10-CM | POA: Insufficient documentation

## 2018-07-18 DIAGNOSIS — R51 Headache: Secondary | ICD-10-CM | POA: Insufficient documentation

## 2018-07-18 DIAGNOSIS — R519 Headache, unspecified: Secondary | ICD-10-CM

## 2018-07-18 NOTE — Therapy (Signed)
[place], [place], [address] Phone: [phone]   Fax:  [phone]  Physical Therapy Treatment  Patient Details  Name: Kelli Tran MRN: [id number] Date of Birth: [birth year] Referring Provider: Eldridge Abrahams   Encounter Date: 07/18/2018  PT End of Session - 07/18/18 1725    Visit Number  2    PT Start Time  1450    PT Stop Time  1540    PT Time Calculation (min)  50 min       Past Medical History:  Diagnosis Date  . Heart murmur    childhood  . Hypertension   . Migraine   . Migraine without aura, with intractable migraine, so stated, without mention of status migrainosus 04/09/2014  . Other and unspecified ovarian cysts     Past Surgical History:  Procedure Laterality Date  . BREAST SURGERY    . DIAGNOSTIC LAPAROSCOPY     ectopic  . DILATION AND CURETTAGE OF UTERUS    . MYOMECTOMY    . ROBOTIC ASSISTED TOTAL HYSTERECTOMY WITH SALPINGECTOMY Bilateral 06/01/2016   Procedure: ROBOTIC ASSISTED TOTAL HYSTERECTOMY WITH BILATERAL SALPINGECTOMY;  Surgeon: Servando Salina, MD;  Location: [facility];  Service: Gynecology;  Laterality: Bilateral;    There were no vitals filed for this visit.  Subjective Assessment - 07/18/18 1647    Subjective  when weather changes that is a trigger for my HA, relief 1 day with estim. alot of driving lately which aggravated everything    Currently in Pain?  Yes    Pain Score  7     Pain Location  Neck                       [facility] Adult PT Treatment/Exercise - 07/18/18 0001      Exercises   Exercises  Neck;Shoulder      Neck Exercises: Machines for Strengthening   UBE (Upper Arm Bike)  L 2 2 fwd/2 back      Neck Exercises: Theraband   Scapula Retraction  10 reps;Red    Shoulder Extension  10 reps;Red    Shoulder ADduction  10 reps;Red    Shoulder External Rotation  10 reps;Red      Neck Exercises: Standing   Neck Retraction  10  reps;3 secs   head on ball     Neck Exercises: Supine   Other Supine Exercise  head on ball cerv retraction and SB   PTA passive ROM and end range stretch     Moist Heat Therapy   Number Minutes Moist Heat  15 Minutes    Moist Heat Location  Cervical      Electrical Stimulation   Electrical Stimulation Location  C/T area    Electrical Stimulation Action  IFC    Electrical Stimulation Parameters  supine    Electrical Stimulation Goals  Pain      Manual Therapy   Manual Therapy  Soft tissue mobilization;Passive ROM;Manual Traction    Manual therapy comments  tightness R>L trap and rhom    Soft tissue mobilization  cerv/trap and RT rhom    Passive ROM  with end range stretch    Manual Traction  increased pain       Trigger Point Dry Needling - 07/18/18 1745    Consent Given?  Yes    Education Handout Provided  Yes    Muscles Treated Upper Body  Upper trapezius;Rhomboids  Upper Trapezius Response  Palpable increased muscle length    Rhomboids Response  Twitch response elicited;Palpable increased muscle length           PT Education - 07/18/18 1728    Education Details  gave info for TENS to order online    Person(s) Educated  Patient    Methods  Explanation;Handout       PT Short Term Goals - 07/08/18 1557      PT SHORT TERM GOAL #1   Title  independent with initial HEP    Time  2    Period  Weeks    Status  New        PT Long Term Goals - 07/08/18 1557      PT LONG TERM GOAL #1   Title  decrease pain 50%    Time  8    Period  Weeks    Status  New      PT LONG TERM GOAL #2   Title  decrease HA frequency by 50%    Time  8    Period  Weeks    Status  New      PT LONG TERM GOAL #3   Title  increase cervical ROM to WNL's    Time  8    Period  Weeks    Status  New      PT LONG TERM GOAL #4   Title  understand posture and body mechanics    Time  8    Period  Weeks    Status  New            Plan - 07/18/18 1725    Clinical Impression  Statement  pt with large trigger pt in RT rhom shoot pain under scap and trightness throughout BIL cerv and UT but responded well to STW, did not like manual traction. good cerv ROM but tight at end range. tolerated inital ther ex fair with postural cuing needed    PT Treatment/Interventions  ADLs/Self Care Home Management;Cryotherapy;Electrical Stimulation;Moist Heat;Traction;Ultrasound;Therapeutic exercise;Therapeutic activities;Patient/family education;Manual techniques;Dry needling    PT Next Visit Plan  assess today session and progress       Patient will benefit from skilled therapeutic intervention in order to improve the following deficits and impairments:  Decreased range of motion, Increased muscle spasms, Pain, Improper body mechanics, Impaired flexibility, Decreased strength, Postural dysfunction  Visit Diagnosis: Acute intractable headache, unspecified headache type  Cramp and spasm  Cervicalgia     Problem List Patient Active Problem List   Diagnosis Date Noted  . Primary snoring 03/29/2017  . Chest pain 12/29/2016  . History of essential hypertension 12/29/2016  . Elevated troponin 12/29/2016  . S/P hysterectomy 06/01/2016  . Migraine without aura, with intractable migraine, so stated, without mention of status migrainosus 04/09/2014    Sumner Boast., PT 07/18/2018, 5:45 PM  [place] [address] [place], [place], [address] Phone: [phone]   Fax:  [phone]  Name: Kelli Tran MRN: [id number] Date of Birth: 12-25-1970

## 2018-07-18 NOTE — Patient Instructions (Signed)

## 2018-07-22 ENCOUNTER — Encounter: Payer: Self-pay | Admitting: Physical Therapy

## 2018-07-22 ENCOUNTER — Ambulatory Visit: Payer: BLUE CROSS/BLUE SHIELD | Admitting: Physical Therapy

## 2018-07-22 DIAGNOSIS — R252 Cramp and spasm: Secondary | ICD-10-CM

## 2018-07-22 DIAGNOSIS — R51 Headache: Principal | ICD-10-CM

## 2018-07-22 DIAGNOSIS — M542 Cervicalgia: Secondary | ICD-10-CM

## 2018-07-22 DIAGNOSIS — R519 Headache, unspecified: Secondary | ICD-10-CM

## 2018-07-22 NOTE — Therapy (Signed)
[place], [place], [address] Phone: [phone]   Fax:  [phone]  Physical Therapy Treatment  Patient Details  Name: EDDY TERMINE MRN: [id number] Date of Birth: Sep 25, 1971 Referring Provider: Eldridge Abrahams   Encounter Date: 07/22/2018  PT End of Session - 07/22/18 1734    Visit Number  3    Date for PT Re-Evaluation  09/07/18    PT Start Time  6948    PT Stop Time  1738    PT Time Calculation (min)  52 min    Activity Tolerance  Patient tolerated treatment well    Behavior During Therapy  [facility] for tasks assessed/performed       Past Medical History:  Diagnosis Date  . Heart murmur    childhood  . Hypertension   . Migraine   . Migraine without aura, with intractable migraine, so stated, without mention of status migrainosus 04/09/2014  . Other and unspecified ovarian cysts     Past Surgical History:  Procedure Laterality Date  . BREAST SURGERY    . DIAGNOSTIC LAPAROSCOPY     ectopic  . DILATION AND CURETTAGE OF UTERUS    . MYOMECTOMY    . ROBOTIC ASSISTED TOTAL HYSTERECTOMY WITH SALPINGECTOMY Bilateral 06/01/2016   Procedure: ROBOTIC ASSISTED TOTAL HYSTERECTOMY WITH BILATERAL SALPINGECTOMY;  Surgeon: Servando Salina, MD;  Location: [facility];  Service: Gynecology;  Laterality: Bilateral;    There were no vitals filed for this visit.  Subjective Assessment - 07/22/18 1651    Subjective  Reports that she has been doing "pretty good"    Currently in Pain?  Yes    Pain Score  4     Pain Location  Neck    Pain Descriptors / Indicators  Tightness;Spasm    Aggravating Factors   stress                       [facility] Adult PT Treatment/Exercise - 07/22/18 0001      Exercises   Exercises  Neck      Neck Exercises: Machines for Strengthening   UBE (Upper Arm Bike)  L 2 2 fwd/2 back      Neck Exercises: Theraband   Scapula Retraction  Red;15 reps    Shoulder  Extension  Red;15 reps    Shoulder ADduction  Red;15 reps    Shoulder External Rotation  Red;15 reps      Neck Exercises: Standing   Other Standing Exercises  back to wall W back , and weighted ball over head lift    Other Standing Exercises  4# shrugs with upper trap and levator stretches      Neck Exercises: Supine   Other Supine Exercise  head on ball cerv retraction and SB      Moist Heat Therapy   Number Minutes Moist Heat  15 Minutes    Moist Heat Location  Cervical      Electrical Stimulation   Electrical Stimulation Location  C/T area    Electrical Stimulation Action  IFC    Electrical Stimulation Parameters  supine    Electrical Stimulation Goals  Pain      Manual Therapy   Manual Therapy  Soft tissue mobilization;Passive ROM;Manual Traction    Soft tissue mobilization  cerv/trap and RT rhom    Manual Traction  very gentle cervical distraction, occipital realease  PT Short Term Goals - 07/22/18 1742      PT SHORT TERM GOAL #1   Title  independent with initial HEP    Status  Achieved        PT Long Term Goals - 07/08/18 1557      PT LONG TERM GOAL #1   Title  decrease pain 50%    Time  8    Period  Weeks    Status  New      PT LONG TERM GOAL #2   Title  decrease HA frequency by 50%    Time  8    Period  Weeks    Status  New      PT LONG TERM GOAL #3   Title  increase cervical ROM to WNL's    Time  8    Period  Weeks    Status  New      PT LONG TERM GOAL #4   Title  understand posture and body mechanics    Time  8    Period  Weeks    Status  New            Plan - 07/22/18 1735    Clinical Impression Statement  Patient reports that she is feeling better, she still has knots in the rhomboids and the upper trap, she has a very tender are in the right cervical area today.  C/O some symptoms that may be facet related, head turns seems to really get her pain level up and grab, she is very gaurded to palpation and motions     PT Next Visit Plan  continue to work on scapular stabilizaiton and try to get her to be less guarded    Consulted and Agree with Plan of Care  Patient       Patient will benefit from skilled therapeutic intervention in order to improve the following deficits and impairments:  Decreased range of motion, Increased muscle spasms, Pain, Improper body mechanics, Impaired flexibility, Decreased strength, Postural dysfunction  Visit Diagnosis: Acute intractable headache, unspecified headache type  Cramp and spasm  Cervicalgia     Problem List Patient Active Problem List   Diagnosis Date Noted  . Primary snoring 03/29/2017  . Chest pain 12/29/2016  . History of essential hypertension 12/29/2016  . Elevated troponin 12/29/2016  . S/P hysterectomy 06/01/2016  . Migraine without aura, with intractable migraine, so stated, without mention of status migrainosus 04/09/2014    Sumner Boast., PT 07/22/2018, 5:43 PM  [place] [address] [place], [place], [address] Phone: [phone]   Fax:  [phone]  Name: ELLIANAH CORDY MRN: [id number] Date of Birth: Feb 09, 1971

## 2018-07-25 ENCOUNTER — Encounter: Payer: Self-pay | Admitting: Physical Therapy

## 2018-07-25 ENCOUNTER — Ambulatory Visit: Payer: BLUE CROSS/BLUE SHIELD | Admitting: Physical Therapy

## 2018-07-25 DIAGNOSIS — R51 Headache: Secondary | ICD-10-CM | POA: Diagnosis not present

## 2018-07-25 DIAGNOSIS — M542 Cervicalgia: Secondary | ICD-10-CM

## 2018-07-25 DIAGNOSIS — R252 Cramp and spasm: Secondary | ICD-10-CM

## 2018-07-25 DIAGNOSIS — R519 Headache, unspecified: Secondary | ICD-10-CM

## 2018-07-25 NOTE — Therapy (Signed)
[place], [place], [address] Phone: [phone]   Fax:  [phone]  Physical Therapy Treatment  Patient Details  Name: CORDIE BEAZLEY MRN: [id number] Date of Birth: [birth year] Referring Provider: Eldridge Abrahams   Encounter Date: 07/25/2018  PT End of Session - 07/25/18 1731    Visit Number  4    Date for PT Re-Evaluation  09/07/18    PT Start Time  7353    PT Stop Time  1741    PT Time Calculation (min)  51 min    Activity Tolerance  Patient tolerated treatment well    Behavior During Therapy  [facility] for tasks assessed/performed       Past Medical History:  Diagnosis Date  . Heart murmur    childhood  . Hypertension   . Migraine   . Migraine without aura, with intractable migraine, so stated, without mention of status migrainosus 04/09/2014  . Other and unspecified ovarian cysts     Past Surgical History:  Procedure Laterality Date  . BREAST SURGERY    . DIAGNOSTIC LAPAROSCOPY     ectopic  . DILATION AND CURETTAGE OF UTERUS    . MYOMECTOMY    . ROBOTIC ASSISTED TOTAL HYSTERECTOMY WITH SALPINGECTOMY Bilateral 06/01/2016   Procedure: ROBOTIC ASSISTED TOTAL HYSTERECTOMY WITH BILATERAL SALPINGECTOMY;  Surgeon: Servando Salina, MD;  Location: [facility];  Service: Gynecology;  Laterality: Bilateral;    There were no vitals filed for this visit.  Subjective Assessment - 07/25/18 1654    Subjective  reports had a HA earlier today.  It is getting better.    Currently in Pain?  Yes    Pain Score  4     Pain Location  Neck    Pain Relieving Factors  patient wanted to try the DN because she thought it helped                       [facility] Adult PT Treatment/Exercise - 07/25/18 0001      Exercises   Exercises  Neck      Neck Exercises: Machines for Strengthening   UBE (Upper Arm Bike)  L 2 2 fwd/2 back    Cybex Row  10# 2x10    Lat Pull  15# 2x10      Neck Exercises:  Theraband   Shoulder External Rotation  Red;20 reps      Neck Exercises: Standing   Other Standing Exercises  back to wall W back , and weighted ball over head lift    Other Standing Exercises  4# shrugs with upper trap and levator stretches      Neck Exercises: Seated   Other Seated Exercise  3# bent over row, 2# bent over extension      Moist Heat Therapy   Number Minutes Moist Heat  15 Minutes    Moist Heat Location  Cervical      Electrical Stimulation   Electrical Stimulation Location  C/T area    Electrical Stimulation Action  IFC    Electrical Stimulation Parameters  supine    Electrical Stimulation Goals  Pain      Manual Therapy   Manual Therapy  Soft tissue mobilization;Passive ROM;Manual Traction    Soft tissue mobilization  cerv/trap and RT rhom       Trigger Point Dry Needling - 07/25/18 1730    Consent Given?  Yes    Upper Trapezius Response  Twitch  reponse elicited;Palpable increased muscle length    Rhomboids Response  Twitch response elicited             PT Short Term Goals - 07/22/18 1742      PT SHORT TERM GOAL #1   Title  independent with initial HEP    Status  Achieved        PT Long Term Goals - 07/25/18 1733      PT LONG TERM GOAL #1   Title  decrease pain 50%    Status  On-going      PT LONG TERM GOAL #2   Title  decrease HA frequency by 50%    Status  On-going            Plan - 07/25/18 1732    History and Personal Factors relevant to plan of care:  Patient is less guarded overall, allows more palpation and is less hesitant to try the exercises.  She is still very tight in the upper trap and rhomboids.  Needs cues to not elevate the shoulders during exercises    PT Next Visit Plan  continue to work on scapular stabilizaiton and try to get her to be less guarded    Consulted and Agree with Plan of Care  Patient       Patient will benefit from skilled therapeutic intervention in order to improve the following deficits and  impairments:  Decreased range of motion, Increased muscle spasms, Pain, Improper body mechanics, Impaired flexibility, Decreased strength, Postural dysfunction  Visit Diagnosis: Acute intractable headache, unspecified headache type  Cramp and spasm  Cervicalgia     Problem List Patient Active Problem List   Diagnosis Date Noted  . Primary snoring 03/29/2017  . Chest pain 12/29/2016  . History of essential hypertension 12/29/2016  . Elevated troponin 12/29/2016  . S/P hysterectomy 06/01/2016  . Migraine without aura, with intractable migraine, so stated, without mention of status migrainosus 04/09/2014    Sumner Boast., PT 07/25/2018, 5:34 PM  [place] [address] [place], [place], [address] Phone: [phone]   Fax:  [phone]  Name: BLANNIE SHEDLOCK MRN: [id number] Date of Birth: 1971/07/13

## 2018-07-29 ENCOUNTER — Encounter: Payer: Self-pay | Admitting: Physical Therapy

## 2018-07-29 ENCOUNTER — Ambulatory Visit: Payer: BLUE CROSS/BLUE SHIELD | Admitting: Physical Therapy

## 2018-07-29 DIAGNOSIS — R51 Headache: Secondary | ICD-10-CM | POA: Diagnosis not present

## 2018-07-29 DIAGNOSIS — M542 Cervicalgia: Secondary | ICD-10-CM

## 2018-07-29 DIAGNOSIS — R519 Headache, unspecified: Secondary | ICD-10-CM

## 2018-07-29 DIAGNOSIS — R252 Cramp and spasm: Secondary | ICD-10-CM

## 2018-07-29 NOTE — Therapy (Signed)
[place], [place], [address] Phone: [phone]   Fax:  [phone]  Physical Therapy Treatment  Patient Details  Name: SHONTEL SANTEE MRN: [id number] Date of Birth: 18-Apr-1971 Referring Provider: Eldridge Abrahams   Encounter Date: 07/29/2018  PT End of Session - 07/29/18 1715    Visit Number  5    Date for PT Re-Evaluation  09/07/18    PT Start Time  1640    PT Stop Time  1730    PT Time Calculation (min)  50 min    Activity Tolerance  Patient limited by pain    Behavior During Therapy  [facility] for tasks assessed/performed       Past Medical History:  Diagnosis Date  . Heart murmur    childhood  . Hypertension   . Migraine   . Migraine without aura, with intractable migraine, so stated, without mention of status migrainosus 04/09/2014  . Other and unspecified ovarian cysts     Past Surgical History:  Procedure Laterality Date  . BREAST SURGERY    . DIAGNOSTIC LAPAROSCOPY     ectopic  . DILATION AND CURETTAGE OF UTERUS    . MYOMECTOMY    . ROBOTIC ASSISTED TOTAL HYSTERECTOMY WITH SALPINGECTOMY Bilateral 06/01/2016   Procedure: ROBOTIC ASSISTED TOTAL HYSTERECTOMY WITH BILATERAL SALPINGECTOMY;  Surgeon: Servando Salina, MD;  Location: [facility];  Service: Gynecology;  Laterality: Bilateral;    There were no vitals filed for this visit.  Subjective Assessment - 07/29/18 1641    Subjective  Reports that she got a migraine late Friday and it lasted until now ans she still is feeling uneasy    Currently in Pain?  Yes    Pain Score  7     Pain Location  Neck    Aggravating Factors   stress, barometric pressure changes                       [facility] Adult PT Treatment/Exercise - 07/29/18 0001      Moist Heat Therapy   Number Minutes Moist Heat  15 Minutes    Moist Heat Location  Cervical      Electrical Stimulation   Electrical Stimulation Location  C/T area    Electrical  Stimulation Action  IFC    Electrical Stimulation Parameters  supine    Electrical Stimulation Goals  Pain      Manual Therapy   Manual Therapy  Soft tissue mobilization;Passive ROM;Manual Traction    Manual therapy comments  some MFR on trigger points    Soft tissue mobilization  cerv/trap and RT rhom    Passive ROM  with end range stretch    Manual Traction  very gentle cervical distraction, occipital realease               PT Short Term Goals - 07/22/18 1742      PT SHORT TERM GOAL #1   Title  independent with initial HEP    Status  Achieved        PT Long Term Goals - 07/25/18 1733      PT LONG TERM GOAL #1   Title  decrease pain 50%    Status  On-going      PT LONG TERM GOAL #2   Title  decrease HA frequency by 50%    Status  On-going            Plan - 07/29/18  [place]    Clinical Impression Statement  Patient with a migraine most of the weekend and appears to be hurting today squinting with light and rubbing eye and temples.  She has significant spasms and tightness in the cervical and upper trap areas    PT Next Visit Plan  continue to work on scapular stabilizaiton and try to get her to be less guarded    Consulted and Agree with Plan of Care  Patient       Patient will benefit from skilled therapeutic intervention in order to improve the following deficits and impairments:  Decreased range of motion, Increased muscle spasms, Pain, Improper body mechanics, Impaired flexibility, Decreased strength, Postural dysfunction  Visit Diagnosis: Acute intractable headache, unspecified headache type  Cramp and spasm  Cervicalgia     Problem List Patient Active Problem List   Diagnosis Date Noted  . Primary snoring 03/29/2017  . Chest pain 12/29/2016  . History of essential hypertension 12/29/2016  . Elevated troponin 12/29/2016  . S/P hysterectomy 06/01/2016  . Migraine without aura, with intractable migraine, so stated, without mention of status  migrainosus 04/09/2014    Sumner Boast., PT 07/29/2018, 5:17 PM  [place] [address] [place], [place], [address] Phone: [phone]   Fax:  [phone]  Name: TRENESE HAFT MRN: [id number] Date of Birth: [birth year]

## 2018-07-31 ENCOUNTER — Encounter: Payer: Self-pay | Admitting: Physical Therapy

## 2018-08-05 ENCOUNTER — Ambulatory Visit: Payer: BLUE CROSS/BLUE SHIELD | Admitting: Physical Therapy

## 2018-08-05 ENCOUNTER — Encounter: Payer: Self-pay | Admitting: Physical Therapy

## 2018-08-05 DIAGNOSIS — R51 Headache: Secondary | ICD-10-CM | POA: Diagnosis not present

## 2018-08-05 DIAGNOSIS — R252 Cramp and spasm: Secondary | ICD-10-CM

## 2018-08-05 DIAGNOSIS — R519 Headache, unspecified: Secondary | ICD-10-CM

## 2018-08-05 DIAGNOSIS — M542 Cervicalgia: Secondary | ICD-10-CM

## 2018-08-05 NOTE — Therapy (Signed)
[place], [place], [address] Phone: [phone]   Fax:  [phone]  Physical Therapy Treatment  Patient Details  Name: Kelli Tran MRN: [id number] Date of Birth: 1970-12-02 Referring Provider: Eldridge Abrahams   Encounter Date: 08/05/2018  PT End of Session - 08/05/18 1632    Visit Number  6    Date for PT Re-Evaluation  09/07/18    PT Start Time  3664    PT Stop Time  1643    PT Time Calculation (min)  49 min    Activity Tolerance  Patient tolerated treatment well    Behavior During Therapy  [facility] for tasks assessed/performed       Past Medical History:  Diagnosis Date  . Heart murmur    childhood  . Hypertension   . Migraine   . Migraine without aura, with intractable migraine, so stated, without mention of status migrainosus 04/09/2014  . Other and unspecified ovarian cysts     Past Surgical History:  Procedure Laterality Date  . BREAST SURGERY    . DIAGNOSTIC LAPAROSCOPY     ectopic  . DILATION AND CURETTAGE OF UTERUS    . MYOMECTOMY    . ROBOTIC ASSISTED TOTAL HYSTERECTOMY WITH SALPINGECTOMY Bilateral 06/01/2016   Procedure: ROBOTIC ASSISTED TOTAL HYSTERECTOMY WITH BILATERAL SALPINGECTOMY;  Surgeon: Servando Salina, MD;  Location: [facility];  Service: Gynecology;  Laterality: Bilateral;    There were no vitals filed for this visit.  Subjective Assessment - 08/05/18 1555    Subjective  Patient reports that she is doing better but still a HA, tension in the neck    Currently in Pain?  Yes    Pain Score  5     Pain Location  Neck    Pain Descriptors / Indicators  Spasm;Tender    Aggravating Factors   stress                       [facility] Adult PT Treatment/Exercise - 08/05/18 0001      Neck Exercises: Machines for Strengthening   UBE (Upper Arm Bike)  L 2 2 fwd/2 back      Neck Exercises: Theraband   Scapula Retraction  20 reps;Red    Shoulder Extension  20  reps;Red    Shoulder External Rotation  Red;20 reps      Neck Exercises: Standing   Other Standing Exercises  back to wall W back , and weighted ball over head lift    Other Standing Exercises  4# shrugs with upper trap and levator stretches      Neck Exercises: Seated   Other Seated Exercise  3# bent over row, 2# bent over extension      Neck Exercises: Supine   Other Supine Exercise  head on ball cerv retraction and SB      Moist Heat Therapy   Number Minutes Moist Heat  15 Minutes    Moist Heat Location  Cervical      Electrical Stimulation   Electrical Stimulation Location  C/T area    Electrical Stimulation Action  IFC    Electrical Stimulation Parameters  supine    Electrical Stimulation Goals  Pain      Manual Therapy   Manual Therapy  Soft tissue mobilization;Passive ROM;Manual Traction    Manual therapy comments  some MFR on trigger points    Soft tissue mobilization  cerv/trap and RT rhom  Passive ROM  with end range stretch    Manual Traction  very gentle cervical distraction, occipital realease             PT Education - 08/05/18 1632    Education Details  HEP for tband scapular stabilization    Person(s) Educated  Patient    Methods  Explanation;Demonstration;Verbal cues;Tactile cues;Handout    Comprehension  Verbalized understanding;Returned demonstration       PT Short Term Goals - 07/22/18 1742      PT SHORT TERM GOAL #1   Title  independent with initial HEP    Status  Achieved        PT Long Term Goals - 07/25/18 1733      PT LONG TERM GOAL #1   Title  decrease pain 50%    Status  On-going      PT LONG TERM GOAL #2   Title  decrease HA frequency by 50%    Status  On-going            Plan - 08/05/18 1633    Clinical Impression Statement  Patient doing better than the last time I saw her, she has significant spasms in the SCM mms and the cervical parapsinals.  She needed some cues with the form for exercises and I stressed this  with her HEP    PT Next Visit Plan  may work on the [facility] as it was very tender today    Consulted and Agree with Plan of Care  Patient       Patient will benefit from skilled therapeutic intervention in order to improve the following deficits and impairments:  Decreased range of motion, Increased muscle spasms, Pain, Improper body mechanics, Impaired flexibility, Decreased strength, Postural dysfunction  Visit Diagnosis: Acute intractable headache, unspecified headache type  Cramp and spasm  Cervicalgia     Problem List Patient Active Problem List   Diagnosis Date Noted  . Primary snoring 03/29/2017  . Chest pain 12/29/2016  . History of essential hypertension 12/29/2016  . Elevated troponin 12/29/2016  . S/P hysterectomy 06/01/2016  . Migraine without aura, with intractable migraine, so stated, without mention of status migrainosus 04/09/2014    Sumner Boast., PT 08/05/2018, 4:35 PM  [place] [address] [place], [place], [address] Phone: [phone]   Fax:  [phone]  Name: Kelli Tran MRN: [id number] Date of Birth: Apr 10, 1971

## 2018-08-08 ENCOUNTER — Ambulatory Visit: Payer: BLUE CROSS/BLUE SHIELD | Admitting: Physical Therapy

## 2018-08-08 DIAGNOSIS — M542 Cervicalgia: Secondary | ICD-10-CM

## 2018-08-08 DIAGNOSIS — R252 Cramp and spasm: Secondary | ICD-10-CM

## 2018-08-08 DIAGNOSIS — R51 Headache: Principal | ICD-10-CM

## 2018-08-08 DIAGNOSIS — R519 Headache, unspecified: Secondary | ICD-10-CM

## 2018-08-08 NOTE — Therapy (Signed)
[place], [place], [address] Phone: [phone]   Fax:  [phone]  Physical Therapy Treatment  Patient Details  Name: Kelli Tran MRN: [id number] Date of Birth: 1970-12-01 Referring Provider (PT): Eldridge Abrahams   Encounter Date: 08/08/2018  PT End of Session - 08/08/18 1737    Visit Number  7    Date for PT Re-Evaluation  09/07/18    PT Start Time  1062    PT Stop Time  1745    PT Time Calculation (min)  55 min       Past Medical History:  Diagnosis Date  . Heart murmur    childhood  . Hypertension   . Migraine   . Migraine without aura, with intractable migraine, so stated, without mention of status migrainosus 04/09/2014  . Other and unspecified ovarian cysts     Past Surgical History:  Procedure Laterality Date  . BREAST SURGERY    . DIAGNOSTIC LAPAROSCOPY     ectopic  . DILATION AND CURETTAGE OF UTERUS    . MYOMECTOMY    . ROBOTIC ASSISTED TOTAL HYSTERECTOMY WITH SALPINGECTOMY Bilateral 06/01/2016   Procedure: ROBOTIC ASSISTED TOTAL HYSTERECTOMY WITH BILATERAL SALPINGECTOMY;  Surgeon: Servando Salina, MD;  Location: [facility];  Service: Gynecology;  Laterality: Bilateral;    There were no vitals filed for this visit.  Subjective Assessment - 08/08/18 1653    Subjective  tension and HA    Currently in Pain?  Yes    Pain Score  6     Pain Location  Neck                       [facility] Adult PT Treatment/Exercise - 08/08/18 0001      Neck Exercises: Machines for Strengthening   UBE (Upper Arm Bike)  L 3 2 fwd/2 back      Modalities   Modalities  Ultrasound      Moist Heat Therapy   Number Minutes Moist Heat  15 Minutes    Moist Heat Location  Cervical      Ultrasound   Ultrasound Location  Cerv/trap/rhom    Ultrasound Parameters  COMBO with estim    Ultrasound Goals  Pain      Manual Therapy   Manual Therapy  Soft tissue mobilization;Passive  ROM;Manual Traction    Manual therapy comments  some MFR on trigger points   with cupping   Soft tissue mobilization  cerv/trap and RT rhom   RT arm in IR to get under scap   Passive ROM  with end range stretch               PT Short Term Goals - 07/22/18 1742      PT SHORT TERM GOAL #1   Title  independent with initial HEP    Status  Achieved        PT Long Term Goals - 08/08/18 1739      PT LONG TERM GOAL #1   Title  decrease pain 50%    Status  Partially Met      PT LONG TERM GOAL #2   Title  decrease HA frequency by 50%    Status  Partially Met      PT LONG TERM GOAL #3   Title  increase cervical ROM to WNL's    Status  On-going      PT LONG TERM GOAL #4  Title  understand posture and body mechanics    Status  Achieved            Plan - 08/08/18 1738    Clinical Impression Statement  increased HA and tension, focus session on MT - added combo tx and added cupping with MT. working towards all goals but limited with HA and tension. good postural educ and HEP    PT Treatment/Interventions  ADLs/Self Care Home Management;Cryotherapy;Electrical Stimulation;Moist Heat;Traction;Ultrasound;Therapeutic exercise;Therapeutic activities;Patient/family education;Manual techniques;Dry needling    PT Next Visit Plan  assess todays session and progress       Patient will benefit from skilled therapeutic intervention in order to improve the following deficits and impairments:  Decreased range of motion, Increased muscle spasms, Pain, Improper body mechanics, Impaired flexibility, Decreased strength, Postural dysfunction  Visit Diagnosis: Acute intractable headache, unspecified headache type  Cramp and spasm  Cervicalgia     Problem List Patient Active Problem List   Diagnosis Date Noted  . Primary snoring 03/29/2017  . Chest pain 12/29/2016  . History of essential hypertension 12/29/2016  . Elevated troponin 12/29/2016  . S/P hysterectomy 06/01/2016   . Migraine without aura, with intractable migraine, so stated, without mention of status migrainosus 04/09/2014    Kelli Tran,Kelli Tran 08/08/2018, 5:41 PM  [place] [address] [place], [place], [address] Phone: [phone]   Fax:  [phone]  Name: Kelli Tran MRN: [id number] Date of Birth: [birth year]

## 2018-08-12 ENCOUNTER — Ambulatory Visit: Payer: BLUE CROSS/BLUE SHIELD | Admitting: Physical Therapy

## 2018-08-12 ENCOUNTER — Encounter: Payer: Self-pay | Admitting: Physical Therapy

## 2018-08-12 DIAGNOSIS — M542 Cervicalgia: Secondary | ICD-10-CM

## 2018-08-12 DIAGNOSIS — R519 Headache, unspecified: Secondary | ICD-10-CM

## 2018-08-12 DIAGNOSIS — R252 Cramp and spasm: Secondary | ICD-10-CM

## 2018-08-12 DIAGNOSIS — R51 Headache: Principal | ICD-10-CM

## 2018-08-12 NOTE — Therapy (Signed)
[place] [address] [place] [place], [place], [address] Phone: [phone]   Fax:  [phone]  Physical Therapy Treatment  Patient Details  Name: Kelli Tran MRN: [id number] Date of Birth: [birth year] Referring Provider (PT): Eldridge Abrahams   Encounter Date: 08/12/2018  PT End of Session - 08/12/18 1712    Visit Number  8    Date for PT Re-Evaluation  09/07/18    PT Start Time  1630   heat   PT Stop Time  1725    PT Time Calculation (min)  55 min    Activity Tolerance  Patient tolerated treatment well    Behavior During Therapy  [facility] for tasks assessed/performed       Past Medical History:  Diagnosis Date  . Heart murmur    childhood  . Hypertension   . Migraine   . Migraine without aura, with intractable migraine, so stated, without mention of status migrainosus 04/09/2014  . Other and unspecified ovarian cysts     Past Surgical History:  Procedure Laterality Date  . BREAST SURGERY    . DIAGNOSTIC LAPAROSCOPY     ectopic  . DILATION AND CURETTAGE OF UTERUS    . MYOMECTOMY    . ROBOTIC ASSISTED TOTAL HYSTERECTOMY WITH SALPINGECTOMY Bilateral 06/01/2016   Procedure: ROBOTIC ASSISTED TOTAL HYSTERECTOMY WITH BILATERAL SALPINGECTOMY;  Surgeon: Servando Salina, MD;  Location: [facility];  Service: Gynecology;  Laterality: Bilateral;    There were no vitals filed for this visit.  Subjective Assessment - 08/12/18 1637    Subjective  I am feeling better today    Currently in Pain?  Yes    Pain Score  3     Pain Location  Neck    Pain Descriptors / Indicators  Spasm                       [facility] Adult PT Treatment/Exercise - 08/12/18 0001      Neck Exercises: Machines for Strengthening   UBE (Upper Arm Bike)  Nustep Level 5 x 5 minutes      Neck Exercises: Theraband   Scapula Retraction  20 reps;Red    Shoulder Extension  20 reps;Red    Shoulder ADduction  20 reps;Red    Shoulder External  Rotation  Red;20 reps      Neck Exercises: Standing   Other Standing Exercises  back to wall W back , and weighted ball over head lift, started to c/o some tightness into the neck     Other Standing Exercises  4# shrugs with upper trap and levator stretches      Ultrasound   Ultrasound Location  trap and cervical area    Ultrasound Parameters  combo    Ultrasound Goals  Pain      Manual Therapy   Manual Therapy  Soft tissue mobilization;Passive ROM;Manual Traction    Soft tissue mobilization  to the upper traps, the rhomboids and the cervical area               PT Short Term Goals - 07/22/18 1742      PT SHORT TERM GOAL #1   Title  independent with initial HEP    Status  Achieved        PT Long Term Goals - 08/12/18 1713      PT LONG TERM GOAL #1   Title  decrease pain 50%    Status  Partially Met  PT LONG TERM GOAL #2   Title  decrease HA frequency by 50%    Status  Partially Met      PT LONG TERM GOAL #3   Title  increase cervical ROM to WNL's    Status  Partially Met      PT LONG TERM GOAL #4   Title  understand posture and body mechanics    Status  Achieved            Plan - 08/12/18 1712    Clinical Impression Statement  Patient had some increased tightness iwth the W backs, I feel like she was using good for, she used to need cues to not elevate the shoulders so this was a little disappointing that she had the tension with these easy exercises.    PT Next Visit Plan  try to advance exercises     Consulted and Agree with Plan of Care  Patient       Patient will benefit from skilled therapeutic intervention in order to improve the following deficits and impairments:  Decreased range of motion, Increased muscle spasms, Pain, Improper body mechanics, Impaired flexibility, Decreased strength, Postural dysfunction  Visit Diagnosis: Acute intractable headache, unspecified headache type  Cramp and spasm  Cervicalgia     Problem  List Patient Active Problem List   Diagnosis Date Noted  . Primary snoring 03/29/2017  . Chest pain 12/29/2016  . History of essential hypertension 12/29/2016  . Elevated troponin 12/29/2016  . S/P hysterectomy 06/01/2016  . Migraine without aura, with intractable migraine, so stated, without mention of status migrainosus 04/09/2014    Sumner Boast., PT 08/12/2018, 5:14 PM  [address] [place], [place], [address] Phone: [phone]   Fax:  [phone]  Name: Kelli Tran MRN: [id number] Date of Birth: 1971/05/01

## 2018-08-15 ENCOUNTER — Ambulatory Visit: Payer: BLUE CROSS/BLUE SHIELD | Attending: Nurse Practitioner | Admitting: Physical Therapy

## 2018-08-15 ENCOUNTER — Encounter: Payer: Self-pay | Admitting: Physical Therapy

## 2018-08-15 DIAGNOSIS — R51 Headache: Secondary | ICD-10-CM | POA: Diagnosis present

## 2018-08-15 DIAGNOSIS — M542 Cervicalgia: Secondary | ICD-10-CM | POA: Insufficient documentation

## 2018-08-15 DIAGNOSIS — R252 Cramp and spasm: Secondary | ICD-10-CM | POA: Insufficient documentation

## 2018-08-15 DIAGNOSIS — R519 Headache, unspecified: Secondary | ICD-10-CM

## 2018-08-15 NOTE — Therapy (Signed)
Kelli Tran, [place], [address] Phone: [phone]   Fax:  [phone]  Physical Therapy Treatment  Patient Details  Name: Kelli Tran MRN: [id number] Date of Birth: 1971/02/25 Referring Provider (PT): Eldridge Abrahams   Encounter Date: 08/15/2018  PT End of Session - 08/15/18 1817    Visit Number  9    Date for PT Re-Evaluation  09/07/18    PT Start Time  1635    PT Stop Time  1739    PT Time Calculation (min)  64 min    Activity Tolerance  Patient tolerated treatment well    Behavior During Therapy  [facility] for tasks assessed/performed       Past Medical History:  Diagnosis Date  . Heart murmur    childhood  . Hypertension   . Migraine   . Migraine without aura, with intractable migraine, so stated, without mention of status migrainosus 04/09/2014  . Other and unspecified ovarian cysts     Past Surgical History:  Procedure Laterality Date  . BREAST SURGERY    . DIAGNOSTIC LAPAROSCOPY     ectopic  . DILATION AND CURETTAGE OF UTERUS    . MYOMECTOMY    . ROBOTIC ASSISTED TOTAL HYSTERECTOMY WITH SALPINGECTOMY Bilateral 06/01/2016   Procedure: ROBOTIC ASSISTED TOTAL HYSTERECTOMY WITH BILATERAL SALPINGECTOMY;  Surgeon: Servando Salina, MD;  Location: [facility];  Service: Gynecology;  Laterality: Bilateral;    There were no vitals filed for this visit.  Subjective Assessment - 08/15/18 1815    Subjective  Reports that she had a HA yesterday, mild today    Currently in Pain?  Yes    Pain Score  4     Pain Location  Neck    Aggravating Factors   stress, sitting, lights                       [facility] Adult PT Treatment/Exercise - 08/15/18 0001      Neck Exercises: Machines for Strengthening   UBE (Upper Arm Bike)  Nustep Level 5 x 5 minutes    Cybex Row  15# 2x10    Lat Pull  15# 2x10      Neck Exercises: Theraband   Shoulder External Rotation  Red;20 reps      Neck  Exercises: Standing   Other Standing Exercises  back to wall W back , and weighted ball over head lift, started to c/o some tightness into the neck , rhythmic stabilization    Other Standing Exercises  4# shrugs with upper trap and levator stretches      Neck Exercises: Seated   Other Seated Exercise  3# bent over row, 2# bent over extension, 1# bent over reveres flies      Neck Exercises: Supine   Other Supine Exercise  head on ball cerv retraction and SB      Moist Heat Therapy   Number Minutes Moist Heat  15 Minutes    Moist Heat Location  Cervical      Electrical Stimulation   Electrical Stimulation Location  C/T area    Electrical Stimulation Action  IFC    Electrical Stimulation Parameters  supine    Electrical Stimulation Goals  Pain      Manual Therapy   Manual Therapy  Soft tissue mobilization;Passive ROM;Manual Traction    Soft tissue mobilization  to the upper traps, the rhomboids and the cervical area    Passive  ROM  with end range stretch    Manual Traction  very gentle cervical distraction, occipital realease               PT Short Term Goals - 07/22/18 1742      PT SHORT TERM GOAL #1   Title  independent with initial HEP    Status  Achieved        PT Long Term Goals - 08/12/18 1713      PT LONG TERM GOAL #1   Title  decrease pain 50%    Status  Partially Met      PT LONG TERM GOAL #2   Title  decrease HA frequency by 50%    Status  Partially Met      PT LONG TERM GOAL #3   Title  increase cervical ROM to WNL's    Status  Partially Met      PT LONG TERM GOAL #4   Title  understand posture and body mechanics    Status  Achieved            Plan - 08/15/18 1818    Clinical Impression Statement  Patient continues to have some ups and downs, she reports that she feels that she is better but some things are still causing the tension and the HA's, her spasms seem to be less but are still very much prominent    PT Next Visit Plan  may try  a little more aggressive STM    Consulted and Agree with Plan of Care  Patient       Patient will benefit from skilled therapeutic intervention in order to improve the following deficits and impairments:  Decreased range of motion, Increased muscle spasms, Pain, Improper body mechanics, Impaired flexibility, Decreased strength, Postural dysfunction  Visit Diagnosis: Acute intractable headache, unspecified headache type  Cramp and spasm  Cervicalgia     Problem List Patient Active Problem List   Diagnosis Date Noted  . Primary snoring 03/29/2017  . Chest pain 12/29/2016  . History of essential hypertension 12/29/2016  . Elevated troponin 12/29/2016  . S/P hysterectomy 06/01/2016  . Migraine without aura, with intractable migraine, so stated, without mention of status migrainosus 04/09/2014    Sumner Boast., PT 08/15/2018, 6:19 PM  [address] [place], [place], [address] Phone: [phone]   Fax:  [phone]  Name: Kelli Tran MRN: [id number] Date of Birth: 1971/02/04

## 2018-08-19 ENCOUNTER — Ambulatory Visit: Payer: BLUE CROSS/BLUE SHIELD | Admitting: Physical Therapy

## 2018-08-19 ENCOUNTER — Encounter: Payer: Self-pay | Admitting: Physical Therapy

## 2018-08-19 DIAGNOSIS — R51 Headache: Secondary | ICD-10-CM | POA: Diagnosis not present

## 2018-08-19 DIAGNOSIS — R519 Headache, unspecified: Secondary | ICD-10-CM

## 2018-08-19 DIAGNOSIS — R252 Cramp and spasm: Secondary | ICD-10-CM

## 2018-08-19 DIAGNOSIS — M542 Cervicalgia: Secondary | ICD-10-CM

## 2018-08-19 NOTE — Therapy (Signed)
[place] [address] [place], [place], [address] Phone: [phone]   Fax:  [phone]  Physical Therapy Treatment  Patient Details  Name: WETONA VIRAMONTES MRN: [id number] Date of Birth: 1971/10/11 Referring Provider (PT): Eldridge Abrahams   Encounter Date: 08/19/2018  PT End of Session - 08/19/18 1754    Visit Number  10    Date for PT Re-Evaluation  09/07/18    PT Start Time  1656    PT Stop Time  1743    PT Time Calculation (min)  47 min    Activity Tolerance  Patient tolerated treatment well    Behavior During Therapy  [facility] for tasks assessed/performed       Past Medical History:  Diagnosis Date  . Heart murmur    childhood  . Hypertension   . Migraine   . Migraine without aura, with intractable migraine, so stated, without mention of status migrainosus 04/09/2014  . Other and unspecified ovarian cysts     Past Surgical History:  Procedure Laterality Date  . BREAST SURGERY    . DIAGNOSTIC LAPAROSCOPY     ectopic  . DILATION AND CURETTAGE OF UTERUS    . MYOMECTOMY    . ROBOTIC ASSISTED TOTAL HYSTERECTOMY WITH SALPINGECTOMY Bilateral 06/01/2016   Procedure: ROBOTIC ASSISTED TOTAL HYSTERECTOMY WITH BILATERAL SALPINGECTOMY;  Surgeon: Servando Salina, MD;  Location: [facility];  Service: Gynecology;  Laterality: Bilateral;    There were no vitals filed for this visit.  Subjective Assessment - 08/19/18 1729    Subjective  Reports that she has had a really bad HA for the past few days, she c/o increased spasms    Currently in Pain?  Yes    Pain Score  7     Pain Location  Neck    Pain Orientation  Right    Pain Descriptors / Indicators  Spasm    Aggravating Factors   unsure of a cause                       [facility] Adult PT Treatment/Exercise - 08/19/18 0001      Moist Heat Therapy   Number Minutes Moist Heat  15 Minutes    Moist Heat Location  Cervical      Electrical Stimulation   Electrical Stimulation Location  C/T area    Electrical Stimulation Action  IFC    Electrical Stimulation Parameters  supine    Electrical Stimulation Goals  Pain      Manual Therapy   Manual Therapy  Soft tissue mobilization;Passive ROM;Manual Traction    Soft tissue mobilization  to the upper traps, the rhomboids and the cervical area    Passive ROM  with end range stretch    Manual Traction  very gentle cervical distraction, occipital realease       Trigger Point Dry Needling - 08/19/18 1754    Consent Given?  Yes    Upper Trapezius Response  Twitch reponse elicited    Rhomboids Response  Palpable increased muscle length             PT Short Term Goals - 07/22/18 1742      PT SHORT TERM GOAL #1   Title  independent with initial HEP    Status  Achieved        PT Long Term Goals - 08/12/18 1713      PT LONG TERM GOAL #1   Title  decrease  pain 50%    Status  Partially Met      PT LONG TERM GOAL #2   Title  decrease HA frequency by 50%    Status  Partially Met      PT LONG TERM GOAL #3   Title  increase cervical ROM to WNL's    Status  Partially Met      PT LONG TERM GOAL #4   Title  understand posture and body mechanics    Status  Achieved            Plan - 08/19/18 1755    Clinical Impression Statement  Patient with increased pain, spasms and HA over the weekend, she is unsure of a specifica cause.  She reports that it started Friday night.  Much more spasms on the right in the upper trap than the left, she was very tender today    PT Next Visit Plan  may try a little more aggressive STM    Consulted and Agree with Plan of Care  Patient       Patient will benefit from skilled therapeutic intervention in order to improve the following deficits and impairments:  Decreased range of motion, Increased muscle spasms, Pain, Improper body mechanics, Impaired flexibility, Decreased strength, Postural dysfunction  Visit Diagnosis: Acute intractable  headache, unspecified headache type  Cramp and spasm  Cervicalgia     Problem List Patient Active Problem List   Diagnosis Date Noted  . Primary snoring 03/29/2017  . Chest pain 12/29/2016  . History of essential hypertension 12/29/2016  . Elevated troponin 12/29/2016  . S/P hysterectomy 06/01/2016  . Migraine without aura, with intractable migraine, so stated, without mention of status migrainosus 04/09/2014    Sumner Boast., PT 08/19/2018, 5:56 PM  [address] [place], [place], [address] Phone: [phone]   Fax:  [phone]  Name: BRITLYN MARTINE MRN: [id number] Date of Birth: 1971-11-01

## 2018-08-22 ENCOUNTER — Ambulatory Visit: Payer: BLUE CROSS/BLUE SHIELD | Admitting: Physical Therapy

## 2018-08-26 ENCOUNTER — Ambulatory Visit: Payer: BLUE CROSS/BLUE SHIELD | Admitting: Physical Therapy

## 2018-08-26 ENCOUNTER — Encounter: Payer: Self-pay | Admitting: Physical Therapy

## 2018-08-26 DIAGNOSIS — M542 Cervicalgia: Secondary | ICD-10-CM

## 2018-08-26 DIAGNOSIS — R519 Headache, unspecified: Secondary | ICD-10-CM

## 2018-08-26 DIAGNOSIS — R252 Cramp and spasm: Secondary | ICD-10-CM

## 2018-08-26 DIAGNOSIS — R51 Headache: Principal | ICD-10-CM

## 2018-08-26 NOTE — Therapy (Signed)
[place], [place], [address] Phone: [phone]   Fax:  [phone]  Physical Therapy Treatment  Patient Details  Name: Kelli Tran MRN: [id number] Date of Birth: January 18, 1971 Referring Provider (PT): Eldridge Abrahams   Encounter Date: 08/26/2018  PT End of Session - 08/26/18 1729    Visit Number  11    Date for PT Re-Evaluation  09/07/18    PT Start Time  1615    PT Stop Time  1705    PT Time Calculation (min)  50 min    Activity Tolerance  Patient tolerated treatment well    Behavior During Therapy  [facility] for tasks assessed/performed       Past Medical History:  Diagnosis Date  . Heart murmur    childhood  . Hypertension   . Migraine   . Migraine without aura, with intractable migraine, so stated, without mention of status migrainosus 04/09/2014  . Other and unspecified ovarian cysts     Past Surgical History:  Procedure Laterality Date  . BREAST SURGERY    . DIAGNOSTIC LAPAROSCOPY     ectopic  . DILATION AND CURETTAGE OF UTERUS    . MYOMECTOMY    . ROBOTIC ASSISTED TOTAL HYSTERECTOMY WITH SALPINGECTOMY Bilateral 06/01/2016   Procedure: ROBOTIC ASSISTED TOTAL HYSTERECTOMY WITH BILATERAL SALPINGECTOMY;  Surgeon: Servando Salina, MD;  Location: [facility];  Service: Gynecology;  Laterality: Bilateral;    There were no vitals filed for this visit.  Subjective Assessment - 08/26/18 1726    Subjective  Patient reports another time with increased HA, unsure of cause but reports a different "weather pattern"    Currently in Pain?  Yes    Pain Score  6     Pain Location  Neck    Pain Orientation  Right;Left    Pain Descriptors / Indicators  Spasm                       [facility] Adult PT Treatment/Exercise - 08/26/18 0001      Moist Heat Therapy   Number Minutes Moist Heat  15 Minutes    Moist Heat Location  Cervical      Electrical Stimulation   Electrical Stimulation  Location  C/T area    Electrical Stimulation Action  IFC    Electrical Stimulation Parameters  supine    Electrical Stimulation Goals  Pain      Ultrasound   Ultrasound Location  trap and cervical area combo    Ultrasound Parameters  combo treatment US/estim    Ultrasound Goals  Pain      Manual Therapy   Manual Therapy  Soft tissue mobilization;Passive ROM;Manual Traction    Soft tissue mobilization  to the upper traps, the rhomboids and the cervical area    Passive ROM  with end range stretch    Manual Traction  very gentle cervical distraction, occipital realease       Trigger Point Dry Needling - 08/26/18 1728    Consent Given?  Yes    Muscles Treated Upper Body  Oblique capitus    Upper Trapezius Response  Twitch reponse elicited    Oblique Capitus Response  Palpable increased muscle length             PT Short Term Goals - 07/22/18 1742      PT SHORT TERM GOAL #1   Title  independent with initial HEP    Status  Achieved        PT Long Term Goals - 08/12/18 1713      PT LONG TERM GOAL #1   Title  decrease pain 50%    Status  Partially Met      PT LONG TERM GOAL #2   Title  decrease HA frequency by 50%    Status  Partially Met      PT LONG TERM GOAL #3   Title  increase cervical ROM to WNL's    Status  Partially Met      PT LONG TERM GOAL #4   Title  understand posture and body mechanics    Status  Achieved            Plan - 08/26/18 1730    Clinical Impression Statement  Patient continues to have spasms had much more twitch responses today in the upper traps and in the cervical parapsinals.  She has been doing well but the last two visist sseems to have had increased HA and spasms    PT Next Visit Plan  may try a little more aggressive STM    Consulted and Agree with Plan of Care  Patient       Patient will benefit from skilled therapeutic intervention in order to improve the following deficits and impairments:  Decreased range of motion,  Increased muscle spasms, Pain, Improper body mechanics, Impaired flexibility, Decreased strength, Postural dysfunction  Visit Diagnosis: Acute intractable headache, unspecified headache type  Cramp and spasm  Cervicalgia     Problem List Patient Active Problem List   Diagnosis Date Noted  . Primary snoring 03/29/2017  . Chest pain 12/29/2016  . History of essential hypertension 12/29/2016  . Elevated troponin 12/29/2016  . S/P hysterectomy 06/01/2016  . Migraine without aura, with intractable migraine, so stated, without mention of status migrainosus 04/09/2014    Sumner Boast., PT 08/26/2018, 5:31 PM  [address] [place], [place], [address] Phone: [phone]   Fax:  [phone]  Name: Kelli Tran MRN: [id number] Date of Birth: 1971/01/09

## 2018-08-29 ENCOUNTER — Encounter: Payer: Self-pay | Admitting: Physical Therapy

## 2018-08-29 ENCOUNTER — Ambulatory Visit: Payer: BLUE CROSS/BLUE SHIELD | Admitting: Physical Therapy

## 2018-08-29 DIAGNOSIS — R51 Headache: Principal | ICD-10-CM

## 2018-08-29 DIAGNOSIS — R252 Cramp and spasm: Secondary | ICD-10-CM

## 2018-08-29 DIAGNOSIS — M542 Cervicalgia: Secondary | ICD-10-CM

## 2018-08-29 DIAGNOSIS — R519 Headache, unspecified: Secondary | ICD-10-CM

## 2018-08-29 NOTE — Therapy (Signed)
[place] [address] [place], [place], [address] Phone: [phone]   Fax:  [phone]  Physical Therapy Treatment  Patient Details  Name: NATALYIA INNES MRN: [id number] Date of Birth: 09/15/1971 Referring Provider (PT): Eldridge Abrahams   Encounter Date: 08/29/2018  PT End of Session - 08/29/18 1806    Visit Number  12    Date for PT Re-Evaluation  09/07/18    PT Start Time  1700    PT Stop Time  1749    PT Time Calculation (min)  49 min    Activity Tolerance  Patient limited by pain    Behavior During Therapy  [facility] for tasks assessed/performed       Past Medical History:  Diagnosis Date  . Heart murmur    childhood  . Hypertension   . Migraine   . Migraine without aura, with intractable migraine, so stated, without mention of status migrainosus 04/09/2014  . Other and unspecified ovarian cysts     Past Surgical History:  Procedure Laterality Date  . BREAST SURGERY    . DIAGNOSTIC LAPAROSCOPY     ectopic  . DILATION AND CURETTAGE OF UTERUS    . MYOMECTOMY    . ROBOTIC ASSISTED TOTAL HYSTERECTOMY WITH SALPINGECTOMY Bilateral 06/01/2016   Procedure: ROBOTIC ASSISTED TOTAL HYSTERECTOMY WITH BILATERAL SALPINGECTOMY;  Surgeon: Servando Salina, MD;  Location: [facility];  Service: Gynecology;  Laterality: Bilateral;    There were no vitals filed for this visit.  Subjective Assessment - 08/29/18 1803    Subjective  Patient comes in today in obvious pain, she reports that she thinks maybe the driving her daughter back to school may have caused this migraine, she squints her eyes and is bothered by light and noise    Currently in Pain?  Yes    Pain Score  9     Pain Location  Neck   head   Aggravating Factors   light and noise make the migraine worse when she has it                       [facility] Adult PT Treatment/Exercise - 08/29/18 0001      Moist Heat Therapy   Number Minutes Moist Heat   15 Minutes    Moist Heat Location  Cervical      Electrical Stimulation   Electrical Stimulation Location  C/T area    Electrical Stimulation Action  IFC    Electrical Stimulation Parameters  supine    Electrical Stimulation Goals  Pain      Manual Therapy   Manual Therapy  Soft tissue mobilization;Passive ROM;Manual Traction    Manual therapy comments  some replication of the HA sx's with a trigger point in the rgiht upper trap    Soft tissue mobilization  to right upper trap, the right cervical and rhomboid areas    Manual Traction  gentle with some occipital release and some gentle head motions with distraction,        Trigger Point Dry Needling - 08/29/18 1805    Consent Given?  Yes    Upper Trapezius Response  Twitch reponse elicited    Oblique Capitus Response  Twitch response elicited    Rhomboids Response  Twitch response elicited             PT Short Term Goals - 07/22/18 1742      PT SHORT TERM GOAL #1   Title  independent with initial HEP    Status  Achieved        PT Long Term Goals - 08/29/18 1808      PT LONG TERM GOAL #1   Title  decrease pain 50%    Status  Partially Met      PT LONG TERM GOAL #2   Title  decrease HA frequency by 50%    Status  Partially Met            Plan - 08/29/18 1806    Clinical Impression Statement  Patient with much worse migraine sx's today, she thinks it could be the driving her daughter back to school, but is unsure.  She has a trigger point in the right upper trap and the right cervical area that replicates some of the migraine pain, there is a trigger point in the right rhomboid that causes pain to go up the shoulder and into the neck,      PT Next Visit Plan  may try a little more aggressive STM    Consulted and Agree with Plan of Care  Patient       Patient will benefit from skilled therapeutic intervention in order to improve the following deficits and impairments:  Decreased range of motion, Increased  muscle spasms, Pain, Improper body mechanics, Impaired flexibility, Decreased strength, Postural dysfunction  Visit Diagnosis: Acute intractable headache, unspecified headache type  Cramp and spasm  Cervicalgia     Problem List Patient Active Problem List   Diagnosis Date Noted  . Primary snoring 03/29/2017  . Chest pain 12/29/2016  . History of essential hypertension 12/29/2016  . Elevated troponin 12/29/2016  . S/P hysterectomy 06/01/2016  . Migraine without aura, with intractable migraine, so stated, without mention of status migrainosus 04/09/2014    Sumner Boast., PT 08/29/2018, 6:08 PM  [address] [place], [place], [address] Phone: [phone]   Fax:  [phone]  Name: TORY MCKISSACK MRN: [id number] Date of Birth: 19-May-1971

## 2018-09-17 ENCOUNTER — Ambulatory Visit: Payer: BLUE CROSS/BLUE SHIELD | Attending: Nurse Practitioner | Admitting: Physical Therapy

## 2018-09-17 ENCOUNTER — Encounter: Payer: Self-pay | Admitting: Physical Therapy

## 2018-09-17 DIAGNOSIS — R252 Cramp and spasm: Secondary | ICD-10-CM | POA: Diagnosis present

## 2018-09-17 DIAGNOSIS — R51 Headache: Secondary | ICD-10-CM | POA: Diagnosis present

## 2018-09-17 DIAGNOSIS — M542 Cervicalgia: Secondary | ICD-10-CM | POA: Diagnosis present

## 2018-09-17 DIAGNOSIS — R519 Headache, unspecified: Secondary | ICD-10-CM

## 2018-09-17 NOTE — Therapy (Signed)
[place] [address] [place], [place], [address] Phone: [phone]   Fax:  [phone]  Physical Therapy Treatment  Patient Details  Name: RENIE STELMACH MRN: [id number] Date of Birth: 1971-11-08 Referring Provider (PT): Eldridge Abrahams   Encounter Date: 09/17/2018  PT End of Session - 09/17/18 1802    Visit Number  13    Date for PT Re-Evaluation  10/17/18    PT Start Time  9509    PT Stop Time  1740    PT Time Calculation (min)  50 min    Activity Tolerance  Patient tolerated treatment well    Behavior During Therapy  [facility] for tasks assessed/performed       Past Medical History:  Diagnosis Date  . Heart murmur    childhood  . Hypertension   . Migraine   . Migraine without aura, with intractable migraine, so stated, without mention of status migrainosus 04/09/2014  . Other and unspecified ovarian cysts     Past Surgical History:  Procedure Laterality Date  . BREAST SURGERY    . DIAGNOSTIC LAPAROSCOPY     ectopic  . DILATION AND CURETTAGE OF UTERUS    . MYOMECTOMY    . ROBOTIC ASSISTED TOTAL HYSTERECTOMY WITH SALPINGECTOMY Bilateral 06/01/2016   Procedure: ROBOTIC ASSISTED TOTAL HYSTERECTOMY WITH BILATERAL SALPINGECTOMY;  Surgeon: Servando Salina, MD;  Location: [facility];  Service: Gynecology;  Laterality: Bilateral;    There were no vitals filed for this visit.  Subjective Assessment - 09/17/18 1651    Subjective  Patient reports that she has had some HA's and one migraine since I saw her last, feels like the stress, driving and her posture at work make the pain worse    Currently in Pain?  Yes    Pain Score  5     Pain Location  Neck    Pain Descriptors / Indicators  Tightness    Aggravating Factors   stress, work, driving                       [facility] PT Treatment/Exercise - 09/17/18 0001      Exercises   Exercises  Neck      Neck Exercises: Machines for Strengthening   Nustep  level 4 x 6 minutes      Neck Exercises: Theraband   Scapula Retraction  20 reps;Red    Shoulder Extension  20 reps;Red    Rows  20 reps;Red    Shoulder External Rotation  Red;20 reps      Neck Exercises: Standing   Other Standing Exercises  W backs, overhead ball lift      Neck Exercises: Seated   Neck Retraction  10 reps      Manual Therapy   Manual Therapy  Soft tissue mobilization;Passive ROM;Manual Traction;Taping    Soft tissue mobilization  to right upper trap, the right cervical and rhomboid areas    Passive ROM  with end range stretch    Manual Traction  gentle with some occipital release and some gentle head motions with distraction,     Brooks;Inhibit Muscle      Kinesiotix   Create Space  upper trap and rhomboid    Inhibit Muscle   upper trap and rhomboid       Trigger Point Dry Needling - 09/17/18 1759    Consent Given?  Yes    Upper Trapezius Response  Twitch  reponse elicited;Palpable increased muscle length             PT Short Term Goals - 07/22/18 1742      PT SHORT TERM GOAL #1   Title  independent with initial HEP    Status  Achieved        PT Long Term Goals - 09/17/18 1803      PT LONG TERM GOAL #1   Title  decrease pain 50%    Status  Partially Met      PT LONG TERM GOAL #2   Title  decrease HA frequency by 50%    Status  Partially Met      PT LONG TERM GOAL #3   Title  increase cervical ROM to WNL's    Status  Achieved      PT LONG TERM GOAL #4   Title  understand posture and body mechanics    Status  Achieved            Plan - 09/17/18 1802    Clinical Impression Statement  Patient has been doing well reports some HA's over the past 2 weeks, she has good ROM and looks as though she feels much better than the last treatment when she was having a full blown migraine.  She continues with knots in the upper traps and the rhomboids, tried K-tape today    PT Next Visit Plan  see if Ktape helps     Consulted and Agree with Plan of Care  Patient       Patient will benefit from skilled therapeutic intervention in order to improve the following deficits and impairments:  Decreased range of motion, Increased muscle spasms, Pain, Improper body mechanics, Impaired flexibility, Decreased strength, Postural dysfunction  Visit Diagnosis: Acute intractable headache, unspecified headache type  Cramp and spasm  Cervicalgia     Problem List Patient Active Problem List   Diagnosis Date Noted  . Primary snoring 03/29/2017  . Chest pain 12/29/2016  . History of essential hypertension 12/29/2016  . Elevated troponin 12/29/2016  . S/P hysterectomy 06/01/2016  . Migraine without aura, with intractable migraine, so stated, without mention of status migrainosus 04/09/2014    Sumner Boast., PT 09/17/2018, 6:04 PM  [place] [place], [place], [address] Phone: [phone]   Fax:  [phone]  Name: MAYELIN PANOS MRN: [id number] Date of Birth: 05-Aug-1971

## 2018-09-19 ENCOUNTER — Ambulatory Visit: Payer: BLUE CROSS/BLUE SHIELD | Admitting: Physical Therapy

## 2018-09-30 ENCOUNTER — Ambulatory Visit: Payer: BLUE CROSS/BLUE SHIELD | Admitting: Physical Therapy

## 2018-09-30 ENCOUNTER — Encounter: Payer: Self-pay | Admitting: Physical Therapy

## 2018-09-30 DIAGNOSIS — R519 Headache, unspecified: Secondary | ICD-10-CM

## 2018-09-30 DIAGNOSIS — R51 Headache: Secondary | ICD-10-CM | POA: Diagnosis not present

## 2018-09-30 DIAGNOSIS — M542 Cervicalgia: Secondary | ICD-10-CM

## 2018-09-30 DIAGNOSIS — R252 Cramp and spasm: Secondary | ICD-10-CM

## 2018-09-30 NOTE — Therapy (Signed)
[place] [address] [place], [place], [address] Phone: [phone]   Fax:  [phone]  Physical Therapy Treatment  Patient Details  Name: Kelli Tran MRN: [id number] Date of Birth: 1971-08-18 Referring Provider (PT): Eldridge Abrahams   Encounter Date: 09/30/2018  PT End of Session - 09/30/18 1742    Visit Number  14    Date for PT Re-Evaluation  10/17/18    PT Start Time  1655    PT Stop Time  1745    PT Time Calculation (min)  50 min    Activity Tolerance  Patient tolerated treatment well    Behavior During Therapy  [facility] for tasks assessed/performed       Past Medical History:  Diagnosis Date  . Heart murmur    childhood  . Hypertension   . Migraine   . Migraine without aura, with intractable migraine, so stated, without mention of status migrainosus 04/09/2014  . Other and unspecified ovarian cysts     Past Surgical History:  Procedure Laterality Date  . BREAST SURGERY    . DIAGNOSTIC LAPAROSCOPY     ectopic  . DILATION AND CURETTAGE OF UTERUS    . MYOMECTOMY    . ROBOTIC ASSISTED TOTAL HYSTERECTOMY WITH SALPINGECTOMY Bilateral 06/01/2016   Procedure: ROBOTIC ASSISTED TOTAL HYSTERECTOMY WITH BILATERAL SALPINGECTOMY;  Surgeon: Servando Salina, MD;  Location: [facility];  Service: Gynecology;  Laterality: Bilateral;    There were no vitals filed for this visit.  Subjective Assessment - 09/30/18 1728    Subjective  Patient reports that she had a 45 year old cousin that was hit by a car and over the past week was in the hospital with the family, the cousin passed this weekend.  She reports that she has had increased stress and HA's while dealing with this    Currently in Pain?  Yes    Pain Score  7     Pain Location  Neck    Pain Descriptors / Indicators  Aching;Spasm;Tightness    Aggravating Factors   stress                       [facility] Adult PT Treatment/Exercise - 09/30/18 0001      Moist Heat Therapy   Number Minutes Moist Heat  15 Minutes    Moist Heat Location  Cervical      Electrical Stimulation   Electrical Stimulation Location  C/T area    Electrical Stimulation Action  IFC    Electrical Stimulation Parameters  supine    Electrical Stimulation Goals  Pain      Manual Therapy   Manual Therapy  Soft tissue mobilization;Passive ROM;Manual Traction;Taping    Soft tissue mobilization  to right upper trap, the right cervical and rhomboid areas    Passive ROM  with end range stretch    Manual Traction  gentle with some occipital release and some gentle head motions with distraction,       Kinesiotix   Create Space  upper trap and rhomboid    Inhibit Muscle   upper trap and rhomboid               PT Short Term Goals - 07/22/18 1742      PT SHORT TERM GOAL #1   Title  independent with initial HEP    Status  Achieved        PT Long Term Goals - 09/30/18 1744  PT LONG TERM GOAL #1   Title  decrease pain 50%    Status  Partially Met      PT LONG TERM GOAL #2   Title  decrease HA frequency by 50%    Status  Partially Met      PT LONG TERM GOAL #3   Title  increase cervical ROM to WNL's    Status  Achieved      PT LONG TERM GOAL #4   Title  understand posture and body mechanics    Status  Achieved            Plan - 09/30/18 1743    Clinical Impression Statement  Patietn reports that she has really had a time with the death of a cousin in a tragic way and she has been asked to perform the euology.  She reports incresaed HA and pain, she has increased knots and tension.  She did feel like the K-tape helped last visit    PT Next Visit Plan  may continue with the taping    Consulted and Agree with Plan of Care  Patient       Patient will benefit from skilled therapeutic intervention in order to improve the following deficits and impairments:  Decreased range of motion, Increased muscle spasms, Pain, Improper body mechanics,  Impaired flexibility, Decreased strength, Postural dysfunction  Visit Diagnosis: Acute intractable headache, unspecified headache type  Cramp and spasm  Cervicalgia     Problem List Patient Active Problem List   Diagnosis Date Noted  . Primary snoring 03/29/2017  . Chest pain 12/29/2016  . History of essential hypertension 12/29/2016  . Elevated troponin 12/29/2016  . S/P hysterectomy 06/01/2016  . Migraine without aura, with intractable migraine, so stated, without mention of status migrainosus 04/09/2014    Sumner Boast., PT 09/30/2018, 5:45 PM  [place] [address] [place], [place], [address] Phone: [phone]   Fax:  [phone]  Name: Kelli Tran MRN: [id number] Date of Birth: September 25, 1971

## 2018-10-14 ENCOUNTER — Ambulatory Visit: Payer: BLUE CROSS/BLUE SHIELD | Attending: Nurse Practitioner | Admitting: Physical Therapy

## 2018-10-14 ENCOUNTER — Encounter: Payer: Self-pay | Admitting: Physical Therapy

## 2018-10-14 DIAGNOSIS — M542 Cervicalgia: Secondary | ICD-10-CM | POA: Diagnosis present

## 2018-10-14 DIAGNOSIS — R252 Cramp and spasm: Secondary | ICD-10-CM | POA: Insufficient documentation

## 2018-10-14 DIAGNOSIS — R519 Headache, unspecified: Secondary | ICD-10-CM

## 2018-10-14 DIAGNOSIS — R51 Headache: Secondary | ICD-10-CM | POA: Insufficient documentation

## 2018-10-14 NOTE — Therapy (Signed)
[facility] [address] [place], [place], [address] Phone: [phone]   Fax:  [phone]  Physical Therapy Treatment  Patient Details  Name: Kelli Tran MRN: [id number] Date of Birth: 12/14/1970 Referring Provider (PT): Penny Jones   Encounter Date: 10/14/2018  PT End of Session - 10/14/18 1731    Visit Number  15    Date for PT Re-Evaluation  10/17/18    PT Start Time  1650    PT Stop Time  1740    PT Time Calculation (min)  50 min    Activity Tolerance  Patient tolerated treatment well    Behavior During Therapy  [facility] for tasks assessed/performed       Past Medical History:  Diagnosis Date  . Heart murmur    childhood  . Hypertension   . Migraine   . Migraine without aura, with intractable migraine, so stated, without mention of status migrainosus 04/09/2014  . Other and unspecified ovarian cysts     Past Surgical History:  Procedure Laterality Date  . BREAST SURGERY    . DIAGNOSTIC LAPAROSCOPY     ectopic  . DILATION AND CURETTAGE OF UTERUS    . MYOMECTOMY    . ROBOTIC ASSISTED TOTAL HYSTERECTOMY WITH SALPINGECTOMY Bilateral 06/01/2016   Procedure: ROBOTIC ASSISTED TOTAL HYSTERECTOMY WITH BILATERAL SALPINGECTOMY;  Surgeon: Sheronette Cousins, MD;  Location: [facility];  Service: Gynecology;  Laterality: Bilateral;    There were no vitals filed for this visit.  Subjective Assessment - 10/14/18 1726    Subjective  Patient reports that she has had a few migraines, reports due to stress she has had from a relative pass away and her taking care of a lot of the arrangements, and doing the eulogy    Currently in Pain?  Yes    Pain Score  6     Pain Location  Neck    Aggravating Factors   stress    Pain Relieving Factors  patient reports that the treatment we do really does help a lot                       [facility] Adult PT Treatment/Exercise - 10/14/18 0001      Moist Heat Therapy   Number Minutes Moist Heat  15 Minutes    Moist Heat Location  Cervical      Electrical Stimulation   Electrical Stimulation Location  C/T area    Electrical Stimulation Action  IFC    Electrical Stimulation Parameters  supine    Electrical Stimulation Goals  Pain      Manual Therapy   Manual Therapy  Soft tissue mobilization;Passive ROM;Manual Traction;Taping    Soft tissue mobilization  to right upper trap, the right cervical and rhomboid areas    Passive ROM  with end range stretch    Manual Traction  gentle with some occipital release and some gentle head motions with distraction,                PT Short Term Goals - 07/22/18 1742      PT SHORT TERM GOAL #1   Title  independent with initial HEP    Status  Achieved        PT Long Term Goals - 10/14/18 1733      PT LONG TERM GOAL #1   Title  decrease pain 50%    Status  Partially Met        PT LONG TERM GOAL #2   Title  decrease HA frequency by 50%    Status  Partially Met      PT LONG TERM GOAL #3   Title  increase cervical ROM to WNL's    Status  Achieved      PT LONG TERM GOAL #4   Title  understand posture and body mechanics    Status  Achieved            Plan - 10/14/18 1732    Clinical Impression Statement  Patietn with increased stress over the past 3 weeks reports that she has had multiple migraines.  She reports that she gets really good and lasting relief when she sees [place] but the stress of a family member dying has really caused some increase of symptoms, she has a lot of knots in the upper traps    PT Next Visit Plan  write renewal at the next visit    Consulted and Agree with Plan of Care  Patient       Patient will benefit from skilled therapeutic intervention in order to improve the following deficits and impairments:  Decreased range of motion, Increased muscle spasms, Pain, Improper body mechanics, Impaired flexibility, Decreased strength, Postural dysfunction  Visit Diagnosis: Acute  intractable headache, unspecified headache type  Cramp and spasm  Cervicalgia     Problem List Patient Active Problem List   Diagnosis Date Noted  . Primary snoring 03/29/2017  . Chest pain 12/29/2016  . History of essential hypertension 12/29/2016  . Elevated troponin 12/29/2016  . S/P hysterectomy 06/01/2016  . Migraine without aura, with intractable migraine, so stated, without mention of status migrainosus 04/09/2014    Sumner Boast., PT 10/14/2018, 5:34 PM  [place] [address] [place], [place], [address] Phone: [phone]   Fax:  [phone]  Name: AIZZA SANTIAGO MRN: [id number] Date of Birth: 1971/05/11

## 2020-01-13 ENCOUNTER — Ambulatory Visit: Payer: Managed Care, Other (non HMO) | Admitting: Neurology

## 2020-01-13 ENCOUNTER — Encounter: Payer: Self-pay | Admitting: Neurology

## 2020-01-13 ENCOUNTER — Other Ambulatory Visit: Payer: Self-pay

## 2020-01-13 DIAGNOSIS — G43511 Persistent migraine aura without cerebral infarction, intractable, with status migrainosus: Secondary | ICD-10-CM | POA: Diagnosis not present

## 2020-01-13 HISTORY — DX: Persistent migraine aura without cerebral infarction, intractable, with status migrainosus: G43.511

## 2020-01-13 MED ORDER — DEXAMETHASONE 2 MG PO TABS: ORAL_TABLET | ORAL | 0 refills | Status: DC

## 2020-01-13 MED ORDER — DICLOFENAC POTASSIUM 50 MG PO TABS: 50.0000 mg | ORAL_TABLET | Freq: Three times a day (TID) | ORAL | 2 refills | Status: DC | PRN

## 2020-01-13 MED ORDER — AIMOVIG 140 MG/ML ~~LOC~~ SOAJ: 140.0000 mg | SUBCUTANEOUS | 4 refills | Status: DC

## 2020-01-13 NOTE — Progress Notes (Signed)
Reason for visit: Intractable migraine headache  Referring physician: Dr. Loleta Tran is a 49 y.o. female  History of present illness:  Kelli Tran is a 49 year old right-handed black female with a history of migraine headache.  The patient was seen through this office in 2015 with migraine.  The patient indicates that usually she will have headaches about 4 times a month, each headache lasts about 2 days.  She will have about 8 headache days a month therefore.  The patient in the past has been tried on Topamax without benefit, she has been placed on amitriptyline up to 75 mg at night but she could not tolerate the side effects of the medication and it did not help her headache.  She is on Norvasc currently at 10 mg daily without benefit.  She has been on tizanidine and baclofen, she has gone through the [place] with Dr. Domingo Tran and received trigger point injections without much benefit.  She has been on Maxalt and Imitrex without improvement of her headache.  Within the last 6 weeks, she converted to daily headaches, the headaches are throughout the head, front and back, but are usually a little bit worse on the right frontal area and behind the right eye.  The patient will have nausea and vomiting, she reports photophobia and phonophobia.  Bright lights, weather changes, and certain odors such as perfumes may bring on headache.  The patient occasionally may have some numbness around the lips if she is exposed to a sudden bright light.  She is missing at least 2 days of work a week.  The patient denies any weakness per se, occasionally she may have some sharp pain in the left lower leg below the knee and some burning sensation in the calf muscle area.  The patient also believes that chocolate and occasionally alcohol may bring on headaches.  The patient reports a strong family history of migraine, her mother and her sister have headache.  She is not sleeping well because  of the severe headache, she is getting only about 4 hours of sleep at night.  She will wake up with a headache.  She does not drink a lot of caffeinated products during the day, she may take an occasional Fioricet, or Tylenol with caffeine for the headache which she does not take many rescue drugs currently.  She is sent to this office for further evaluation and management of her headache.  The patient does have some neck and shoulder discomfort with the headache, she has undergone physical therapy, dry needling, and TENS unit trial for the headache.  Past Medical History:  Diagnosis Date  . Heart murmur    childhood  . Hypertension   . Migraine   . Migraine without aura, with intractable migraine, so stated, without mention of status migrainosus 04/09/2014  . Other and unspecified ovarian cysts     Past Surgical History:  Procedure Laterality Date  . BREAST SURGERY    . DIAGNOSTIC LAPAROSCOPY     ectopic  . DILATION AND CURETTAGE OF UTERUS    . MYOMECTOMY    . ROBOTIC ASSISTED TOTAL HYSTERECTOMY WITH SALPINGECTOMY Bilateral 06/01/2016   Procedure: ROBOTIC ASSISTED TOTAL HYSTERECTOMY WITH BILATERAL SALPINGECTOMY;  Surgeon: Kelli Salina, MD;  Location: [facility];  Service: Gynecology;  Laterality: Bilateral;    Family History  Problem Relation Age of Onset  . Hypertension Mother   . Diabetes Mother   . Migraines Mother   . Graves' disease  Mother   . Hypertension Father   . Breast cancer Sister   . Hypertension Sister   . Migraines Sister     Social history:  reports that she has never smoked. She has never used smokeless tobacco. She reports current alcohol use. She reports that she does not use drugs.  Medications:  Prior to Admission medications   Medication Sig Start Date End Date Taking? Authorizing Provider  amLODipine (NORVASC) 10 MG tablet Take 10 mg by mouth daily.   Yes [provider]  butalbital-acetaminophen-caffeine (FIORICET) 50-325-40 MG tablet Take  0.5-1 tablets by mouth 2 (two) times daily as needed for headache or migraine.   Yes [provider]  Multiple Vitamins-Minerals (MULTIVITAMIN ADULT PO) Take by mouth.   Yes [provider]  ondansetron (ZOFRAN) 4 MG tablet Take 4 mg by mouth every 8 (eight) hours as needed for nausea or vomiting.   Yes [provider]  tiZANidine (ZANAFLEX) 4 MG tablet Take 4 mg by mouth every 6 (six) hours as needed for muscle spasms.   Yes [provider]      Allergies  Allergen Reactions  . Contrast Media [Iodinated Diagnostic Agents] Nausea Only and Other (See Comments)    Caused burning.  . Gadolinium Derivatives Nausea Only and Other (See Comments)    CAUSED BURNING  . Levaquin [Levofloxacin In D5w] Other (See Comments)    "inflammation" burning sensation, generalized  . Other Swelling    Walnuts causes swelling of the tongue.  Kelli Tran [Benzonatate] Swelling    ROS:  Out of a complete 14 system review of symptoms, the patient complains only of the following symptoms, and all other reviewed systems are negative.  Headache Nausea  Blood pressure (!) 146/96, pulse 70, height 5\' 1"  (1.549 m), weight 139 lb (63 kg), last menstrual period 05/31/2016.  Physical Exam  General: The patient is alert and cooperative at the time of the examination.  Eyes: Pupils are equal, round, and reactive to light. Discs are flat bilaterally.  Neck: The neck is supple, no carotid bruits are noted.  Respiratory: The respiratory examination is clear.  Cardiovascular: The cardiovascular examination reveals a regular rate and rhythm, no obvious murmurs or rubs are noted.  Skin: Extremities are without significant edema.  Neurologic Exam  Mental status: The patient is alert and oriented x 3 at the time of the examination. The patient has apparent normal recent and remote memory, with an apparently normal attention span and concentration ability.  Cranial nerves: Facial  symmetry is present. There is good sensation of the face to pinprick and soft touch bilaterally. The strength of the facial muscles and the muscles to head turning and shoulder shrug are normal bilaterally. Speech is well enunciated, no aphasia or dysarthria is noted. Extraocular movements are full. Visual fields are full. The tongue is midline, and the patient has symmetric elevation of the soft palate. No obvious hearing deficits are noted.  Motor: The motor testing reveals 5 over 5 strength of all 4 extremities. Good symmetric motor tone is noted throughout.  Sensory: Sensory testing is intact to pinprick, soft touch, vibration sensation, and position sense on all 4 extremities. No evidence of extinction is noted.  Coordination: Cerebellar testing reveals good finger-nose-finger and heel-to-shin bilaterally.  Gait and station: Gait is normal. Tandem gait is normal. Romberg is negative. No drift is seen.  Reflexes: Deep tendon reflexes are symmetric and normal bilaterally. Toes are downgoing bilaterally.   Assessment/Plan:  1.  Intractable migraine  headache  The patient is having headache that has converted to daily over the last 6 weeks.  The patient has been tried unsuccessfully on Topamax, baclofen, tizanidine, amlodipine, and amitriptyline.  We will initiate treatment with Aimovig currently, I will give her diclofenac potassium to take if needed, she will go on a 3-day course of Decadron.  Previously, a 6-day course of prednisone was not completely effective.  She will call me in the next couple days if she is not getting better, we will bring her in for a Depacon injection if this is the case.  She will follow-up otherwise in 2 to 3 months.  Jill Alexanders MD 01/13/2020 8:39 AM  [facility] [address] [place], [place] [address]  Phone [phone] Fax [phone]

## 2020-01-13 NOTE — Patient Instructions (Signed)
We will start a three day course of decadron and start Aimovig for the headache. Use diclofenac potassium 50 mg if needed for the headache.

## 2020-01-26 ENCOUNTER — Telehealth: Payer: Self-pay | Admitting: Neurology

## 2020-01-26 ENCOUNTER — Other Ambulatory Visit: Payer: Self-pay

## 2020-01-26 MED ORDER — AIMOVIG 140 MG/ML ~~LOC~~ SOAJ: 140.0000 mg | SUBCUTANEOUS | 4 refills | Status: DC

## 2020-01-26 NOTE — Telephone Encounter (Signed)
Pt called stating she is needing her aimovig sent to [facility] for insurance coverage  Fax# [phone] ID#[id number]   Requested a CB

## 2020-01-26 NOTE — Telephone Encounter (Signed)
Aimovig sent to [facility] per pt request.

## 2020-01-29 ENCOUNTER — Telehealth: Payer: Self-pay

## 2020-01-29 NOTE — Telephone Encounter (Signed)
Approved.  Request Reference Number: [id number]. AIMOVIG INJ 140MG /ML is approved through 07/31/2020. Your patient may now fill this prescription and it will be covered.

## 2020-01-29 NOTE — Telephone Encounter (Signed)
Completed PA for Aimovig 140 mg/mL on covermymeds, Key: [id number]. Determination is pending.

## 2020-02-23 ENCOUNTER — Other Ambulatory Visit: Payer: Self-pay

## 2020-02-23 MED ORDER — AIMOVIG 140 MG/ML ~~LOC~~ SOAJ: 140.0000 mg | SUBCUTANEOUS | 3 refills | Status: DC

## 2020-03-22 ENCOUNTER — Ambulatory Visit (HOSPITAL_COMMUNITY)
Admission: EM | Admit: 2020-03-22 | Discharge: 2020-03-22 | Disposition: A | Discharge status: Home / Self Care | Payer: Managed Care, Other (non HMO) | Attending: Family Medicine | Admitting: Family Medicine

## 2020-03-22 ENCOUNTER — Other Ambulatory Visit: Payer: Self-pay

## 2020-03-22 ENCOUNTER — Ambulatory Visit (INDEPENDENT_AMBULATORY_CARE_PROVIDER_SITE_OTHER): Payer: Managed Care, Other (non HMO)

## 2020-03-22 DIAGNOSIS — R079 Chest pain, unspecified: Secondary | ICD-10-CM | POA: Diagnosis not present

## 2020-03-22 DIAGNOSIS — I10 Essential (primary) hypertension: Secondary | ICD-10-CM

## 2020-03-22 NOTE — Discharge Instructions (Addendum)
You have been seen at the [facility] today for chest pain. Your evaluation today was not suggestive of any emergent condition requiring medical intervention at this time. Your chest x-ray and ECG (heart tracing) did not show any worrisome changes. However, some medical problems make take more time to appear. Therefore, it's very important that you pay attention to any new symptoms or worsening of your current condition.  Please proceed directly to the Emergency Department immediately should you feel worse in any way or have any of the following symptoms: increasing or different chest pain, pain that spreads to your arm, neck, jaw, back or abdomen, shortness of breath, or nausea and vomiting.  Continue taking your acid reflux medication to see if this helps over the next few days.  Your blood pressure was noted to be slightly elevated during your visit today. If you are currently taking medication for high blood pressure, please ensure you are taking this as directed. If you do not have a history of high blood pressure and your blood pressure remains persistently elevated, you may need to begin taking a medication at some point. You may return here within the next few days to recheck if unable to see your primary care provider or if do not have a one.  BP (!) 155/92   Pulse 65   Temp 98 F (36.7 C)   Resp 16   LMP 05/31/2016   SpO2 98%

## 2020-03-22 NOTE — ED Triage Notes (Signed)
Pt had second COVID vaccine on Thursday, woke up Friday with chills, fever, headache and chest pain that radiates into upper back with nausea.   States last time this happened she was admitted to the hospital. Feels the same way as then.

## 2020-03-24 NOTE — ED Provider Notes (Signed)
[place]   SN:[id number] 03/22/20 Arrival Time: 0827  ASSESSMENT & PLAN:  1. Chest pain, unspecified type   2. Elevated blood pressure reading in office with diagnosis of hypertension     No suspicion for cardiac related pain at this time. Reassured.    Discharge Instructions     You have been seen at the [facility] today for chest pain. Your evaluation today was not suggestive of any emergent condition requiring medical intervention at this time. Your chest x-ray and ECG (heart tracing) did not show any worrisome changes. However, some medical problems make take more time to appear. Therefore, it's very important that you pay attention to any new symptoms or worsening of your current condition.  Please proceed directly to the Emergency Department immediately should you feel worse in any way or have any of the following symptoms: increasing or different chest pain, pain that spreads to your arm, neck, jaw, back or abdomen, shortness of breath, or nausea and vomiting.  Continue taking your acid reflux medication to see if this helps over the next few days.  Your blood pressure was noted to be slightly elevated during your visit today. If you are currently taking medication for high blood pressure, please ensure you are taking this as directed. If you do not have a history of high blood pressure and your blood pressure remains persistently elevated, you may need to begin taking a medication at some point. You may return here within the next few days to recheck if unable to see your primary care provider or if do not have a one.  BP (!) 155/92   Pulse 65   Temp 98 F (36.7 C)   Resp 16   LMP 05/31/2016   SpO2 98%       ECG: Performed today and interpreted by me: normal EKG, normal sinus rhythm. No STEMI.  I have personally viewed the imaging studies ordered this visit. Normal CXR.   Reviewed expectations re: course of current medical issues. Questions  answered. Outlined signs and symptoms indicating need for more acute intervention. Patient verbalized understanding. After Visit Summary given.   SUBJECTIVE:  History from: patient. Kelli Tran is a 49 y.o. female who reports receiving second COVID shot several days ago. Next day with subj fever, chills, headache, and occasional chest pain. Feeling better but continues to feel pain in chest that occasionally radiates to her back. Without n/v. Ambulatory without difficulty. Sleeping ok. No abdominal pain. Currently without symptoms. Some fatigue. No injury. Afebrile.   Social History   Tobacco Use  Smoking Status Never Smoker  Smokeless Tobacco Never Used   Social History   Substance and Sexual Activity  Alcohol Use Yes   Comment: occ     OBJECTIVE:  Vitals:   03/22/20 0900  BP: (!) 155/92  Pulse: 65  Resp: 16  Temp: 98 F (36.7 C)  SpO2: 98%    General appearance: alert, oriented, no acute distress Eyes: PERRLA; EOMI; conjunctivae normal HENT: normocephalic; atraumatic Neck: supple with FROM Lungs: without labored respirations; speaks full sentences without difficulty; CTAB Heart: regular rate and rhythm without murmer Abdomen: soft, non-tender; no guarding or rebound tenderness Extremities: without edema; without calf swelling or tenderness; symmetrical without gross deformities Skin: warm and dry; without rash or lesions Neuro: normal gait Psychological: alert and cooperative; normal mood and affect   Imaging: DG Chest 2 View  Result Date: 03/22/2020 CLINICAL DATA:  Chest pain EXAM: CHEST - 2 VIEW  COMPARISON:  December 28, 2016 FINDINGS: Lungs are clear. Heart size and pulmonary vascularity are normal. No adenopathy. No pneumothorax. No bone lesions. IMPRESSION: No abnormality noted. Electronically Signed   By: Lowella Grip III M.D.   On: 03/22/2020 10:06     Allergies  Allergen Reactions  . Contrast Media [Iodinated Diagnostic Agents] Nausea  Only and Other (See Comments)    Caused burning.  . Gadolinium Derivatives Nausea Only and Other (See Comments)    CAUSED BURNING  . Levaquin [Levofloxacin In D5w] Other (See Comments)    "inflammation" burning sensation, generalized  . Other Swelling    Walnuts causes swelling of the tongue.  Lavella Lemons [Benzonatate] Swelling    Past Medical History:  Diagnosis Date  . Heart murmur    childhood  . Hypertension   . Migraine   . Migraine aura, persistent, intractable, with status migrainosus 01/13/2020  . Migraine without aura, with intractable migraine, so stated, without mention of status migrainosus 04/09/2014  . Other and unspecified ovarian cysts    Social History   Socioeconomic History  . Marital status: Divorced    Spouse name: Not on file  . Number of children: 2  . Years of education: Grad  . Highest education level: Not on file  Occupational History  . Occupation: Tax inspector: [employer]  Tobacco Use  . Smoking status: Never Smoker  . Smokeless tobacco: Never Used  Substance and Sexual Activity  . Alcohol use: Yes    Comment: occ  . Drug use: No  . Sexual activity: Not on file  Other Topics Concern  . Not on file  Social History Narrative   Drinks 1 cup of coffee a day    Social Determinants of Health   Financial Resource Strain:   . Difficulty of Paying Living Expenses:   Food Insecurity:   . Worried About Charity fundraiser in the Last Year:   . Arboriculturist in the Last Year:   Transportation Needs:   . Film/video editor (Medical):   Marland Kitchen Lack of Transportation (Non-Medical):   Physical Activity:   . Days of Exercise per Week:   . Minutes of Exercise per Session:   Stress:   . Feeling of Stress :   Social Connections:   . Frequency of Communication with Friends and Family:   . Frequency of Social Gatherings with Friends and Family:   . Attends Religious Services:   . Active Member of Clubs or Organizations:   . Attends  Archivist Meetings:   Marland Kitchen Marital Status:   Intimate Partner Violence:   . Fear of Current or Ex-Partner:   . Emotionally Abused:   Marland Kitchen Physically Abused:   . Sexually Abused:    Family History  Problem Relation Age of Onset  . Hypertension Mother   . Diabetes Mother   . Migraines Mother   . Graves' disease Mother   . Hypertension Father   . Breast cancer Sister   . Hypertension Sister   . Migraines Sister    Past Surgical History:  Procedure Laterality Date  . BREAST SURGERY    . DIAGNOSTIC LAPAROSCOPY     ectopic  . DILATION AND CURETTAGE OF UTERUS    . MYOMECTOMY    . ROBOTIC ASSISTED TOTAL HYSTERECTOMY WITH SALPINGECTOMY Bilateral 06/01/2016   Procedure: ROBOTIC ASSISTED TOTAL HYSTERECTOMY WITH BILATERAL SALPINGECTOMY;  Surgeon: Servando Salina, MD;  Location: [facility];  Service: Gynecology;  Laterality: Bilateral;  Vanessa Kick, MD 03/24/20 1031

## 2020-04-19 ENCOUNTER — Encounter: Payer: Self-pay | Admitting: Neurology

## 2020-04-19 ENCOUNTER — Ambulatory Visit: Payer: Managed Care, Other (non HMO) | Admitting: Neurology

## 2020-04-19 VITALS — BP 121/82 | HR 71 | Ht 62.0 in | Wt 135.0 lb

## 2020-04-19 DIAGNOSIS — G43511 Persistent migraine aura without cerebral infarction, intractable, with status migrainosus: Secondary | ICD-10-CM | POA: Diagnosis not present

## 2020-04-19 NOTE — Progress Notes (Signed)
I have read the note, and I agree with the clinical assessment and plan.  Lexander Tremblay K Shukri Nistler   

## 2020-04-19 NOTE — Progress Notes (Signed)
PATIENT: Kelli Tran DOB: 1971-01-25  REASON FOR VISIT: follow up HISTORY FROM: patient  HISTORY OF PRESENT ILLNESS: Today 04/19/20  Kelli Tran 49 year old female with history of migraine headache.  She is previously been seen at the headache center, unsuccessfully tried Topamax, baclofen, tizanidine, amlodipine, and amitriptyline.  When last seen she was started on Aimovig, takes diclofenac potassium as needed, Fioricet, tizanidine.  Has done 3 Aimovig injections at this point, has not seen any major difference in her headaches.  Her daily headaches, are usually right retro-orbital, occipitally bilaterally.  Reports nausea, photophobia, phonophobia.  She may miss work as result, works as an Passenger transport manager for [employer]. For rescue, will take diclofenac and Fioricet or tizanidine and Fioricet.  Combination will dull the headache, but not completely relieve it.  In 2018, she was evaluated for sleep apnea, sleep study was negative for significant OSA.  Presents today for evaluation unaccompanied.  HISTORY 01/13/2020 SS: Kelli Tran is a 49 year old right-handed black female with a history of migraine headache.  The patient was seen through this office in 2015 with migraine.  The patient indicates that usually she will have headaches about 4 times a month, each headache lasts about 2 days.  She will have about 8 headache days a month therefore.  The patient in the past has been tried on Topamax without benefit, she has been placed on amitriptyline up to 75 mg at night but she could not tolerate the side effects of the medication and it did not help her headache.  She is on Norvasc currently at 10 mg daily without benefit.  She has been on tizanidine and baclofen, she has gone through the [facility] with Dr. Domingo Cocking and received trigger point injections without much benefit.  She has been on Maxalt and Imitrex without improvement of her headache.  Within the last 6 weeks, she converted to daily  headaches, the headaches are throughout the head, front and back, but are usually a little bit worse on the right frontal area and behind the right eye.  The patient will have nausea and vomiting, she reports photophobia and phonophobia.  Bright lights, weather changes, and certain odors such as perfumes may bring on headache.  The patient occasionally may have some numbness around the lips if she is exposed to a sudden bright light.  She is missing at least 2 days of work a week.  The patient denies any weakness per se, occasionally she may have some sharp pain in the left lower leg below the knee and some burning sensation in the calf muscle area.  The patient also believes that chocolate and occasionally alcohol may bring on headaches.  The patient reports a strong family history of migraine, her mother and her sister have headache.  She is not sleeping well because of the severe headache, she is getting only about 4 hours of sleep at night.  She will wake up with a headache.  She does not drink a lot of caffeinated products during the day, she may take an occasional Fioricet, or Tylenol with caffeine for the headache which she does not take many rescue drugs currently.  She is sent to this office for further evaluation and management of her headache.  The patient does have some neck and shoulder discomfort with the headache, she has undergone physical therapy, dry needling, and TENS unit trial for the headache.  REVIEW OF SYSTEMS: Out of a complete 14 system review of symptoms, the patient complains only  of the following symptoms, and all other reviewed systems are negative.  Headache  ALLERGIES: Allergies  Allergen Reactions  . Contrast Media [Iodinated Diagnostic Agents] Nausea Only and Other (See Comments)    Caused burning.  . Gadolinium Derivatives Nausea Only and Other (See Comments)    CAUSED BURNING  . Levaquin [Levofloxacin In D5w] Other (See Comments)    "inflammation" burning sensation,  generalized  . Other Swelling    Walnuts causes swelling of the tongue.  Lavella Lemons [Benzonatate] Swelling    HOME MEDICATIONS: Outpatient Medications Prior to Visit  Medication Sig Dispense Refill  . amLODipine (NORVASC) 10 MG tablet Take 10 mg by mouth daily.    . butalbital-acetaminophen-caffeine (FIORICET) 50-325-40 MG tablet Take 0.5-1 tablets by mouth 2 (two) times daily as needed for headache or migraine.    . diclofenac (CATAFLAM) 50 MG tablet Take 1 tablet (50 mg total) by mouth 3 (three) times daily as needed. 30 tablet 2  . Erenumab-aooe (AIMOVIG) 140 MG/ML SOAJ Inject 140 mg into the skin every 30 (thirty) days. 3 pen 3  . Multiple Vitamins-Minerals (MULTIVITAMIN ADULT PO) Take by mouth.    . ondansetron (ZOFRAN) 4 MG tablet Take 4 mg by mouth every 8 (eight) hours as needed for nausea or vomiting.    . pantoprazole (PROTONIX) 40 MG tablet Take 40 mg by mouth daily.    Marland Kitchen tiZANidine (ZANAFLEX) 4 MG tablet Take 4 mg by mouth every 6 (six) hours as needed for muscle spasms.     No facility-administered medications prior to visit.    PAST MEDICAL HISTORY: Past Medical History:  Diagnosis Date  . Heart murmur    childhood  . Hypertension   . Migraine   . Migraine aura, persistent, intractable, with status migrainosus 01/13/2020  . Migraine without aura, with intractable migraine, so stated, without mention of status migrainosus 04/09/2014  . Other and unspecified ovarian cysts     PAST SURGICAL HISTORY: Past Surgical History:  Procedure Laterality Date  . BREAST SURGERY    . DIAGNOSTIC LAPAROSCOPY     ectopic  . DILATION AND CURETTAGE OF UTERUS    . MYOMECTOMY    . ROBOTIC ASSISTED TOTAL HYSTERECTOMY WITH SALPINGECTOMY Bilateral 06/01/2016   Procedure: ROBOTIC ASSISTED TOTAL HYSTERECTOMY WITH BILATERAL SALPINGECTOMY;  Surgeon: Servando Salina, MD;  Location: [facility];  Service: Gynecology;  Laterality: Bilateral;    FAMILY HISTORY: Family History  Problem Relation  Age of Onset  . Hypertension Mother   . Diabetes Mother   . Migraines Mother   . Graves' disease Mother   . Hypertension Father   . Breast cancer Sister   . Hypertension Sister   . Migraines Sister     SOCIAL HISTORY: Social History   Socioeconomic History  . Marital status: Divorced    Spouse name: Not on file  . Number of children: 2  . Years of education: Grad  . Highest education level: Not on file  Occupational History  . Occupation: Tax inspector: [employer]  Tobacco Use  . Smoking status: Never Smoker  . Smokeless tobacco: Never Used  Substance and Sexual Activity  . Alcohol use: Yes    Comment: occ  . Drug use: No  . Sexual activity: Not on file  Other Topics Concern  . Not on file  Social History Narrative   Drinks 1 cup of coffee a day    Social Determinants of Health   Financial Resource Strain:   . Difficulty  of Paying Living Expenses:   Food Insecurity:   . Worried About Charity fundraiser in the Last Year:   . Arboriculturist in the Last Year:   Transportation Needs:   . Film/video editor (Medical):   Marland Kitchen Lack of Transportation (Non-Medical):   Physical Activity:   . Days of Exercise per Week:   . Minutes of Exercise per Session:   Stress:   . Feeling of Stress :   Social Connections:   . Frequency of Communication with Friends and Family:   . Frequency of Social Gatherings with Friends and Family:   . Attends Religious Services:   . Active Member of Clubs or Organizations:   . Attends Archivist Meetings:   Marland Kitchen Marital Status:   Intimate Partner Violence:   . Fear of Current or Ex-Partner:   . Emotionally Abused:   Marland Kitchen Physically Abused:   . Sexually Abused:    PHYSICAL EXAM  Vitals:   04/19/20 1540  BP: 121/82  Pulse: 71  Weight: 135 lb (61.2 kg)  Height: 5\' 2"  (1.575 m)   Body mass index is 24.69 kg/m.  Generalized: Well developed, in no acute distress   Neurological examination  Mentation: Alert  oriented to time, place, history taking. Follows all commands speech and language fluent Cranial nerve II-XII: Pupils were equal round reactive to light. Extraocular movements were full, visual field were full on confrontational test. Facial sensation and strength were normal. Head turning and shoulder shrug  were normal and symmetric. Motor: The motor testing reveals 5 over 5 strength of all 4 extremities. Good symmetric motor tone is noted throughout.  Sensory: Sensory testing is intact to soft touch on all 4 extremities. No evidence of extinction is noted.  Coordination: Cerebellar testing reveals good finger-nose-finger and heel-to-shin bilaterally.  Gait and station: Gait is normal.  Reflexes: Deep tendon reflexes are symmetric and normal bilaterally.   DIAGNOSTIC DATA (LABS, IMAGING, TESTING) - I reviewed patient records, labs, notes, testing and imaging myself where available.  Lab Results  Component Value Date   WBC 6.7 12/29/2016   HGB 12.8 12/29/2016   HCT 36.2 12/29/2016   MCV 81.0 12/29/2016   PLT 251 12/29/2016      Component Value Date/Time   NA 139 12/29/2016 0248   K 3.5 12/29/2016 0248   CL 108 12/29/2016 0248   CO2 23 12/29/2016 0248   GLUCOSE 91 12/29/2016 0248   BUN 6 12/29/2016 0248   CREATININE 0.65 12/29/2016 0248   CALCIUM 8.8 (L) 12/29/2016 0248   GFRNONAA >60 12/29/2016 0248   GFRAA >60 12/29/2016 0248   Lab Results  Component Value Date   CHOL 141 12/31/2016   HDL 42 12/31/2016   LDLCALC 79 12/31/2016   TRIG 101 12/31/2016   CHOLHDL 3.4 12/31/2016   Lab Results  Component Value Date   HGBA1C 5.3 12/31/2016   No results found for: VITAMINB12 No results found for: TSH   ASSESSMENT AND PLAN 49 y.o. year old female  has a past medical history of Heart murmur, Hypertension, Migraine, Migraine aura, persistent, intractable, with status migrainosus (01/13/2020), Migraine without aura, with intractable migraine, so stated, without mention of status  migrainosus (04/09/2014), and Other and unspecified ovarian cysts. here with:  1.  Intractable migraine headache  She continues to report daily migraine headache, has not yet seen any benefit with Aimovig 140 mg monthly injection for the past 3 months.  She has previously unsuccessfully tried Topamax, baclofen,  tizanidine, amlodipine, amitriptyline, and trigger point injections.  At this point, we will consider Botox, I have given her information to review.  She will let me know if she decides to proceed. She will remain on diclofenac and tizanidine as needed for acute headache. She also receives Fioricet from her primary doctor. For now, stay on Aimovig.  She will follow-up in 6 months or sooner if needed.  I spent 20 minutes of face-to-face and non-face-to-face time with patient.  This included previsit chart review, lab review, study review, order entry, electronic health record documentation, patient education.  Butler Denmark, AGNP-C, DNP 04/19/2020, 4:11 PM [facility] [address], [place], [place] [address] [phone]

## 2020-04-19 NOTE — Patient Instructions (Signed)
Let's consider Botox for migraine headaches  Continue current medications See you back in 6 months

## 2020-06-21 ENCOUNTER — Other Ambulatory Visit: Payer: Self-pay

## 2020-06-21 ENCOUNTER — Encounter (HOSPITAL_COMMUNITY): Payer: Self-pay

## 2020-06-21 ENCOUNTER — Ambulatory Visit (HOSPITAL_COMMUNITY)
Admission: EM | Admit: 2020-06-21 | Discharge: 2020-06-21 | Disposition: A | Discharge status: Home / Self Care | Payer: Managed Care, Other (non HMO) | Attending: Family Medicine | Admitting: Family Medicine

## 2020-06-21 DIAGNOSIS — R0789 Other chest pain: Secondary | ICD-10-CM | POA: Diagnosis present

## 2020-06-21 DIAGNOSIS — Z20822 Contact with and (suspected) exposure to covid-19: Secondary | ICD-10-CM | POA: Insufficient documentation

## 2020-06-21 NOTE — ED Provider Notes (Signed)
[place]    CSN: [id number] Arrival date & time: 06/21/20  1525      History   Chief Complaint Chief Complaint  Patient presents with  . Chest Pain  . Cough    HPI Kelli Tran is a 49 y.o. female.   HPI  Encounter for COVID-19 Testing in Asymptomatic Patient for presents to rule out COVID-19 after exposure to mother and daughter. Mother tested positive 06/03/20. Endorses symptoms of cough with chest tightness.  Afebrile during visit today. Denies shortness of breath, weakness, or HA.    Past Medical History:  Diagnosis Date  . Heart murmur    childhood  . Hypertension   . Migraine   . Migraine aura, persistent, intractable, with status migrainosus 01/13/2020  . Migraine without aura, with intractable migraine, so stated, without mention of status migrainosus 04/09/2014  . Other and unspecified ovarian cysts     Patient Active Problem List   Diagnosis Date Noted  . Migraine aura, persistent, intractable, with status migrainosus 01/13/2020  . Primary snoring 03/29/2017  . Chest pain 12/29/2016  . History of essential hypertension 12/29/2016  . Elevated troponin 12/29/2016  . S/P hysterectomy 06/01/2016  . Migraine without aura, with intractable migraine, so stated, without mention of status migrainosus 04/09/2014    Past Surgical History:  Procedure Laterality Date  . BREAST SURGERY    . DIAGNOSTIC LAPAROSCOPY     ectopic  . DILATION AND CURETTAGE OF UTERUS    . MYOMECTOMY    . ROBOTIC ASSISTED TOTAL HYSTERECTOMY WITH SALPINGECTOMY Bilateral 06/01/2016   Procedure: ROBOTIC ASSISTED TOTAL HYSTERECTOMY WITH BILATERAL SALPINGECTOMY;  Surgeon: Servando Salina, MD;  Location: [facility];  Service: Gynecology;  Laterality: Bilateral;    OB History   No obstetric history on file.      Home Medications    Prior to Admission medications   Medication Sig Start Date End Date Taking? Authorizing Provider  amLODipine (NORVASC) 10 MG tablet Take 10 mg  by mouth daily.   Yes [provider]  Erenumab-aooe (AIMOVIG) 140 MG/ML SOAJ Inject 140 mg into the skin every 30 (thirty) days. 02/23/20  Yes Kathrynn Ducking, MD  pantoprazole (PROTONIX) 40 MG tablet Take 40 mg by mouth daily.   Yes [provider]  tiZANidine (ZANAFLEX) 4 MG tablet Take 4 mg by mouth every 6 (six) hours as needed for muscle spasms.   Yes [provider]  butalbital-acetaminophen-caffeine (FIORICET) 50-325-40 MG tablet Take 0.5-1 tablets by mouth 2 (two) times daily as needed for headache or migraine.    [provider]  diclofenac (CATAFLAM) 50 MG tablet Take 1 tablet (50 mg total) by mouth 3 (three) times daily as needed. 01/13/20   Kathrynn Ducking, MD  Multiple Vitamins-Minerals (MULTIVITAMIN ADULT PO) Take by mouth.    [provider]  ondansetron (ZOFRAN) 4 MG tablet Take 4 mg by mouth every 8 (eight) hours as needed for nausea or vomiting.    [provider]    Family History Family History  Problem Relation Age of Onset  . Hypertension Mother   . Diabetes Mother   . Migraines Mother   . Graves' disease Mother   . Hypertension Father   . Breast cancer Sister   . Hypertension Sister   . Migraines Sister     Social History Social History   Tobacco Use  . Smoking status: Never Smoker  . Smokeless tobacco: Never Used  Substance Use Topics  . Alcohol use: Yes  Comment: occ  . Drug use: No     Allergies   Contrast media [iodinated diagnostic agents], Gadolinium derivatives, Levaquin [levofloxacin in d5w], Other, and Tessalon [benzonatate]   Review of Systems Review of Systems Pertinent negatives listed in HPI  Physical Exam Triage Vital Signs ED Triage Vitals  Enc Vitals Group     BP 06/21/20 1748 (!) 137/113     Pulse Rate 06/21/20 1748 79     Resp 06/21/20 1748 18     Temp 06/21/20 1748 98.5 F (36.9 C)     Temp Source 06/21/20 1748 Oral     SpO2 06/21/20 1748 100 %     Weight --       Height --      Head Circumference --      Peak Flow --      Pain Score 06/21/20 1751 5     Pain Loc --      Pain Edu? --      Excl. in [place]? --    No data found.  Updated Vital Signs BP (!) 149/84 (BP Location: Left Arm)   Pulse 79   Temp 98.5 F (36.9 C) (Oral)   Resp 18   LMP 05/31/2016   SpO2 100%   Visual Acuity Right Eye Distance:   Left Eye Distance:   Bilateral Distance:    Right Eye Near:   Left Eye Near:    Bilateral Near:     Physical Exam .General appearance: alert, well developed, well nourished, cooperative and in no distress Head: Normocephalic, without obvious abnormality, atraumatic Respiratory: Respirations even and unlabored, normal respiratory rate Heart: rate and rhythm normal. No gallop or murmurs noted on exam   UC Treatments / Results  Labs (all labs ordered are listed, but only abnormal results are displayed) Labs Reviewed  SARS CORONAVIRUS 2 (TAT 6-24 HRS)    EKG NSR, negative for ST changes.  Radiology No results found.  Procedures Procedures (including critical care time)  Medications Ordered in UC Medications - No data to display  Initial Impression / Assessment and Plan / UC Course  I have reviewed the triage vital signs and the nursing notes.  Pertinent labs & imaging results that were available during my care of the patient were reviewed by me and considered in my medical decision making (see chart for details).     COVID-19 test pending. Patient encouraged to self isolate while test is pending. Continue symptom management at home. If any symptom worsens or becomes severe, go immediately to the ER. Final Clinical Impressions(s) / UC Diagnoses   Final diagnoses:  Exposure to COVID-19 virus  Chest wall pain     Discharge Instructions     Your COVID 19 results will be available in 24 hours. Negative results are immediately resulted to Mychart. Positive results will receive a follow-up call from our clinic. If symptoms are  present, I recommend home quarantine until results are known.  Take Ibuprofen or Aleve as directed to improve chest wall pain.       ED Prescriptions    None     PDMP not reviewed this encounter.   Scot Jun, FNP 06/24/20 2252

## 2020-06-21 NOTE — Discharge Instructions (Addendum)
Your COVID 19 results will be available in 24 hours. Negative results are immediately resulted to Mychart. Positive results will receive a follow-up call from our clinic. If symptoms are present, I recommend home quarantine until results are known.  Take Ibuprofen or Aleve as directed to improve chest wall pain.

## 2020-06-21 NOTE — ED Triage Notes (Signed)
Pt c/o cough, SOB awakening her at night and central/upper CP radiating to back since last Thursday, aching in nature 5/10. Also c/o sore throat, sharp pains in ears. Pt states she awoke with night sweats and 'gasping for air" the other night.  Reports decreased urine o/p and less need to urinate during the night. Pt states she has been under a lot of stress since March 2/2 several members dying, mother in ICU at this time.

## 2020-06-22 LAB — SARS CORONAVIRUS 2 (TAT 6-24 HRS): SARS Coronavirus 2: NEGATIVE

## 2020-07-05 ENCOUNTER — Telehealth: Payer: Self-pay

## 2020-07-05 MED ORDER — EMGALITY 120 MG/ML ~~LOC~~ SOAJ: 120.0000 mg | SUBCUTANEOUS | 11 refills | Status: DC

## 2020-07-05 MED ORDER — DEXAMETHASONE 2 MG PO TABS
ORAL_TABLET | ORAL | 0 refills | Status: DC
Start: 2020-07-05 — End: 2021-04-11

## 2020-07-05 NOTE — Telephone Encounter (Signed)
I called pt.  She is out of work now for 2 wks per her pcp due to her stressors (depression/anxiety/ grief counseling) been on lexapro 10mg  po daily and klonopin 0.5mg  po bid prn.  Wanting to change to amitriptylline for migraine prevention but also that would help with depression.  She is not interested in botox, feels like the aimovig made things worse, has not helped.  Is ok for use steroids if will help with acute migraine (I relayed insomnia, increased appetite and energy as SE).  I relayed that we do not do STD for her migraine, but her pcp will need to follow for STD for other mental issues.  She verbalized understanding.

## 2020-07-05 NOTE — Addendum Note (Signed)
Addended by: Suzzanne Cloud on: 07/05/2020 04:52 PM   Modules accepted: Orders

## 2020-07-05 NOTE — Telephone Encounter (Signed)
I called the patient. Reports daily migraines. No benefit with Aimovig. I will stop Aimovig, start Emgality. She will remain on Lexapro from PCP, she may try the amitriptyline, previously only tried briefly she said. I will send in Decadron 3 day taper for prolonged headache. She is starting couseling for stress/grief, migraines are making them worse. She isn't interested in Botox.

## 2020-07-05 NOTE — Telephone Encounter (Signed)
Pt left a VM asking for a call to discuss her acute migraine for the past two weeks. Please call.

## 2020-07-07 ENCOUNTER — Telehealth: Payer: Self-pay | Admitting: Neurology

## 2020-07-07 NOTE — Telephone Encounter (Signed)
Received fax from [facility]. PA has been approved 07/07/20 to 01/07/21. Will fax a copy of the approval letter to patient's pharmacy.

## 2020-07-07 NOTE — Telephone Encounter (Signed)
Received PA request for Emgality. PA has been started via [email or web address]. Key is [id number]. Per [email or web address], will receive determination within 3 business days. Will check back for determination.

## 2020-10-18 ENCOUNTER — Ambulatory Visit: Payer: Managed Care, Other (non HMO) | Admitting: Neurology

## 2020-11-30 ENCOUNTER — Encounter (HOSPITAL_COMMUNITY): Payer: Self-pay

## 2020-11-30 ENCOUNTER — Ambulatory Visit (HOSPITAL_COMMUNITY)
Admission: EM | Admit: 2020-11-30 | Discharge: 2020-11-30 | Disposition: A | Discharge status: Home / Self Care | Payer: Managed Care, Other (non HMO) | Attending: Family Medicine | Admitting: Family Medicine

## 2020-11-30 DIAGNOSIS — J069 Acute upper respiratory infection, unspecified: Secondary | ICD-10-CM | POA: Diagnosis present

## 2020-11-30 DIAGNOSIS — Z20822 Contact with and (suspected) exposure to covid-19: Secondary | ICD-10-CM | POA: Diagnosis present

## 2020-11-30 MED ORDER — PROMETHAZINE-DM 6.25-15 MG/5ML PO SYRP
5.0000 mL | ORAL_SOLUTION | Freq: Four times a day (QID) | ORAL | 0 refills | Status: DC | PRN
Start: 2020-11-30 — End: 2021-10-10

## 2020-11-30 NOTE — ED Provider Notes (Signed)
[place]    CSN: [id number] Arrival date & time: 11/30/20  1740      History   Chief Complaint Chief Complaint  Patient presents with  . Generalized Body Aches    HPI Kelli Tran is a 50 y.o. female.   Here today with 3 day history of body aches, cough, congestion, neck soreness. Denies CP, SOB, abdominal pain, N/V/D. Children tested positive Saturday for COVID. UTD on vaccines, no known chronic pulmonary dz. Not trying anything OTC for sxs at this time.      Past Medical History:  Diagnosis Date  . Heart murmur    childhood  . Hypertension   . Migraine   . Migraine aura, persistent, intractable, with status migrainosus 01/13/2020  . Migraine without aura, with intractable migraine, so stated, without mention of status migrainosus 04/09/2014  . Other and unspecified ovarian cysts     Patient Active Problem List   Diagnosis Date Noted  . Migraine aura, persistent, intractable, with status migrainosus 01/13/2020  . Primary snoring 03/29/2017  . Chest pain 12/29/2016  . History of essential hypertension 12/29/2016  . Elevated troponin 12/29/2016  . S/P hysterectomy 06/01/2016  . Migraine without aura, with intractable migraine, so stated, without mention of status migrainosus 04/09/2014    Past Surgical History:  Procedure Laterality Date  . BREAST SURGERY    . DIAGNOSTIC LAPAROSCOPY     ectopic  . DILATION AND CURETTAGE OF UTERUS    . MYOMECTOMY    . ROBOTIC ASSISTED TOTAL HYSTERECTOMY WITH SALPINGECTOMY Bilateral 06/01/2016   Procedure: ROBOTIC ASSISTED TOTAL HYSTERECTOMY WITH BILATERAL SALPINGECTOMY;  Surgeon: Servando Salina, MD;  Location: [facility];  Service: Gynecology;  Laterality: Bilateral;    OB History   No obstetric history on file.      Home Medications    Prior to Admission medications   Medication Sig Start Date End Date Taking? Authorizing Provider  promethazine-dextromethorphan (PROMETHAZINE-DM) 6.25-15 MG/5ML syrup Take  5 mLs by mouth 4 (four) times daily as needed for cough. 11/30/20  Yes Volney American, PA-C  valACYclovir (VALTREX) 500 MG tablet Take 500 mg by mouth.   Yes [provider]  amLODipine (NORVASC) 10 MG tablet Take 10 mg by mouth daily.    [provider]  butalbital-acetaminophen-caffeine (FIORICET) 50-325-40 MG tablet Take 0.5-1 tablets by mouth 2 (two) times daily as needed for headache or migraine.    [provider]  dexamethasone (DECADRON) 2 MG tablet Take 3 tablets on day 1, then take 2 tablets on the second day, then take 1 tablet 07/05/20   Suzzanne Cloud, NP  diclofenac (CATAFLAM) 50 MG tablet Take 1 tablet (50 mg total) by mouth 3 (three) times daily as needed. 01/13/20   Kathrynn Ducking, MD  Galcanezumab-gnlm [facility]) 120 MG/ML SOAJ Inject 120 mg into the skin every 30 (thirty) days. 07/05/20   Suzzanne Cloud, NP  Multiple Vitamins-Minerals (MULTIVITAMIN ADULT PO) Take by mouth.    [provider]  ondansetron (ZOFRAN) 4 MG tablet Take 4 mg by mouth every 8 (eight) hours as needed for nausea or vomiting.    [provider]  pantoprazole (PROTONIX) 40 MG tablet Take 40 mg by mouth daily.    [provider]  tiZANidine (ZANAFLEX) 4 MG tablet Take 4 mg by mouth every 6 (six) hours as needed for muscle spasms.    [provider]    Family History Family History  Problem Relation Age of Onset  .  Hypertension Mother   . Diabetes Mother   . Migraines Mother   . Graves' disease Mother   . Hypertension Father   . Cancer Father   . Kidney cancer Father   . Breast cancer Sister   . Hypertension Sister   . Migraines Sister     Social History Social History   Tobacco Use  . Smoking status: Never Smoker  . Smokeless tobacco: Never Used  Substance Use Topics  . Alcohol use: Yes    Comment: occ  . Drug use: No     Allergies   Contrast media [iodinated diagnostic agents], Gadolinium derivatives, Levaquin  [levofloxacin in d5w], Other, and Tessalon [benzonatate]   Review of Systems Review of Systems PER HPI    Physical Exam Triage Vital Signs ED Triage Vitals  Enc Vitals Group     BP 11/30/20 1910 126/82     Pulse Rate 11/30/20 1910 77     Resp --      Temp 11/30/20 1910 98.2 F (36.8 C)     Temp Source 11/30/20 1910 Oral     SpO2 11/30/20 1910 100 %     Weight --      Height 11/30/20 1905 5\' 2"  (1.575 m)     Head Circumference --      Peak Flow --      Pain Score 11/30/20 1905 5     Pain Loc --      Pain Edu? --      Excl. in [place]? --    No data found.  Updated Vital Signs BP 126/82 (BP Location: Right Arm)   Pulse 77   Temp 98.2 F (36.8 C) (Oral)   Ht 5\' 2"  (1.575 m)   LMP 05/31/2016   SpO2 100%   BMI 24.69 kg/m   Visual Acuity Right Eye Distance:   Left Eye Distance:   Bilateral Distance:    Right Eye Near:   Left Eye Near:    Bilateral Near:     Physical Exam Vitals and nursing note reviewed.  Constitutional:      Appearance: Normal appearance. She is not ill-appearing.  HENT:     Head: Atraumatic.     Right Ear: Tympanic membrane normal.     Left Ear: Tympanic membrane normal.     Nose: Rhinorrhea present.     Mouth/Throat:     Mouth: Mucous membranes are moist.     Pharynx: Oropharynx is clear. Posterior oropharyngeal erythema present.  Eyes:     Extraocular Movements: Extraocular movements intact.     Conjunctiva/sclera: Conjunctivae normal.  Cardiovascular:     Rate and Rhythm: Normal rate and regular rhythm.     Heart sounds: Normal heart sounds.  Pulmonary:     Effort: Pulmonary effort is normal. No respiratory distress.     Breath sounds: Normal breath sounds. No wheezing or rales.  Abdominal:     General: Bowel sounds are normal. There is no distension.     Palpations: Abdomen is soft.     Tenderness: There is no abdominal tenderness. There is no guarding.  Musculoskeletal:        General: Normal range of motion.     Cervical  back: Normal range of motion and neck supple.  Skin:    General: Skin is warm and dry.  Neurological:     Mental Status: She is alert and oriented to person, place, and time.  Psychiatric:        Mood and Affect: Mood  normal.        Thought Content: Thought content normal.        Judgment: Judgment normal.     UC Treatments / Results  Labs (all labs ordered are listed, but only abnormal results are displayed) Labs Reviewed  SARS CORONAVIRUS 2 (TAT 6-24 HRS)    EKG   Radiology No results found.  Procedures Procedures (including critical care time)  Medications Ordered in UC Medications - No data to display  Initial Impression / Assessment and Plan / UC Course  I have reviewed the triage vital signs and the nursing notes.  Pertinent labs & imaging results that were available during my care of the patient were reviewed by me and considered in my medical decision making (see chart for details).     Exam and vitals very reassuring, COVID pcr pending, discussed isolation, supportive home care, OTC remedies and phenergan DM prn for cough. Return for acutely worsening sxs.   Final Clinical Impressions(s) / UC Diagnoses   Final diagnoses:  Viral URI with cough  Exposure to COVID-19 virus   Discharge Instructions   None    ED Prescriptions    Medication Sig Dispense Auth. Provider   promethazine-dextromethorphan (PROMETHAZINE-DM) 6.25-15 MG/5ML syrup Take 5 mLs by mouth 4 (four) times daily as needed for cough. 100 mL Volney American, [place]     PDMP not reviewed this encounter.   Volney American, [place] 11/30/20 1937

## 2020-11-30 NOTE — ED Triage Notes (Signed)
Patient has symptoms of covid including body aches, cough, congestion, and neck soreness. Patients children tested positive on Saturday. Patient is vaccinated for Covid and Flu. Patient has not taken any otc medication for symptoms

## 2020-12-01 LAB — SARS CORONAVIRUS 2 (TAT 6-24 HRS): SARS Coronavirus 2: NEGATIVE

## 2021-02-21 ENCOUNTER — Ambulatory Visit (HOSPITAL_COMMUNITY)
Admission: EM | Admit: 2021-02-21 | Discharge: 2021-02-21 | Disposition: A | Discharge status: Home / Self Care | Payer: Managed Care, Other (non HMO) | Attending: Urgent Care | Admitting: Urgent Care

## 2021-02-21 ENCOUNTER — Encounter (HOSPITAL_COMMUNITY): Payer: Self-pay

## 2021-02-21 ENCOUNTER — Ambulatory Visit (INDEPENDENT_AMBULATORY_CARE_PROVIDER_SITE_OTHER): Payer: Managed Care, Other (non HMO)

## 2021-02-21 ENCOUNTER — Other Ambulatory Visit: Payer: Self-pay

## 2021-02-21 DIAGNOSIS — M79645 Pain in left finger(s): Secondary | ICD-10-CM
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
## 2021-02-21 NOTE — Discharge Instructions (Signed)
You can try using the buddy tape system for your middle and ring fingers of the left hand especially when you have to go to work. Contact [facility] for follow up. In the meantime, use your regular pain medicine diclofenac as needed.

## 2021-04-11 ENCOUNTER — Encounter (HOSPITAL_COMMUNITY): Payer: Self-pay | Admitting: Emergency Medicine

## 2021-04-11 ENCOUNTER — Emergency Department (HOSPITAL_COMMUNITY)
Admission: EM | Admit: 2021-04-11 | Discharge: 2021-04-11 | Disposition: A | Discharge status: Home / Self Care | Payer: Managed Care, Other (non HMO) | Attending: Emergency Medicine | Admitting: Emergency Medicine

## 2021-04-11 ENCOUNTER — Other Ambulatory Visit: Payer: Self-pay

## 2021-04-11 ENCOUNTER — Emergency Department (HOSPITAL_COMMUNITY): Payer: Managed Care, Other (non HMO)

## 2021-04-11 DIAGNOSIS — G43709 Chronic migraine without aura, not intractable, without status migrainosus: Secondary | ICD-10-CM

## 2021-04-11 DIAGNOSIS — M542 Cervicalgia: Secondary | ICD-10-CM | POA: Diagnosis not present

## 2021-04-11 DIAGNOSIS — M79641 Pain in right hand: Secondary | ICD-10-CM | POA: Insufficient documentation

## 2021-04-11 DIAGNOSIS — M79642 Pain in left hand: Secondary | ICD-10-CM | POA: Diagnosis not present

## 2021-04-11 DIAGNOSIS — Z79899 Other long term (current) drug therapy: Secondary | ICD-10-CM | POA: Diagnosis not present

## 2021-04-11 DIAGNOSIS — I10 Essential (primary) hypertension: Secondary | ICD-10-CM | POA: Insufficient documentation

## 2021-04-11 DIAGNOSIS — M62830 Muscle spasm of back: Secondary | ICD-10-CM | POA: Insufficient documentation

## 2021-04-11 DIAGNOSIS — R079 Chest pain, unspecified: Secondary | ICD-10-CM | POA: Diagnosis not present

## 2021-04-11 LAB — BASIC METABOLIC PANEL
Anion gap: 6 (ref 5–15)
BUN: 12 mg/dL (ref 6–20)
CO2: 25 mmol/L (ref 22–32)
Calcium: 9.1 mg/dL (ref 8.9–10.3)
Chloride: 107 mmol/L (ref 98–111)
Creatinine, Ser: 0.66 mg/dL (ref 0.44–1.00)
GFR, Estimated: >60 mL/min (ref >60)
Glucose, Bld: 85 mg/dL (ref 70–99)
Potassium: 3.7 mmol/L (ref 3.5–5.1)
Sodium: 138 mmol/L (ref 135–145)

## 2021-04-11 LAB — CBC
HCT: 39.5 % (ref 36.0–46.0)
Hemoglobin: 13.7 g/dL (ref 12.0–15.0)
MCH: 28.8 pg (ref 26.0–34.0)
MCHC: 34.7 g/dL (ref 30.0–36.0)
MCV: 83 fL (ref 80.0–100.0)
Platelets: 305 10*3/uL (ref 150–400)
RBC: 4.76 MIL/uL (ref 3.87–5.11)
RDW: 13.2 % (ref 11.5–15.5)
WBC: 4.5 10*3/uL (ref 4.0–10.5)
nRBC: 0 % (ref 0.0–0.2)

## 2021-04-11 LAB — TROPONIN I (HIGH SENSITIVITY)
Troponin I (High Sensitivity): 2 ng/L (ref <18)
Troponin I (High Sensitivity): <2 ng/L (ref <18)

## 2021-04-11 LAB — I-STAT BETA HCG BLOOD, ED (MC, WL, AP ONLY): I-stat hCG, quantitative: <5 m[IU]/mL (ref <5)

## 2021-04-11 LAB — BRAIN NATRIURETIC PEPTIDE: B Natriuretic Peptide: 85.1 pg/mL (ref 0.0–100.0)

## 2021-04-11 MED ORDER — PREDNISONE 10 MG PO TABS: 40.0000 mg | ORAL_TABLET | Freq: Every day | ORAL | 0 refills | Status: AC

## 2021-04-11 MED ORDER — SODIUM CHLORIDE 0.9 % IV BOLUS
1000.0000 mL | Freq: Once | INTRAVENOUS | Status: AC
  Administered 2021-04-11: 1000 mL via INTRAVENOUS

## 2021-04-11 MED ORDER — DIPHENHYDRAMINE HCL 50 MG/ML IJ SOLN
25.0000 mg | Freq: Once | INTRAMUSCULAR | Status: AC
  Administered 2021-04-11: 25 mg via INTRAVENOUS
  Filled 2021-04-11: qty 1

## 2021-04-11 MED ORDER — PROCHLORPERAZINE EDISYLATE 10 MG/2ML IJ SOLN: 10.0000 mg | Freq: Four times a day (QID) | INTRAMUSCULAR | Status: DC | PRN

## 2021-04-11 MED ORDER — DEXAMETHASONE SODIUM PHOSPHATE 10 MG/ML IJ SOLN
10.0000 mg | Freq: Once | INTRAMUSCULAR | Status: AC
  Administered 2021-04-11: 10 mg via INTRAVENOUS
  Filled 2021-04-11: qty 1

## 2021-04-11 NOTE — ED Notes (Signed)
Patient transported to CT 
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
## 2021-04-11 NOTE — ED Notes (Signed)
Pt in gown and on monitors. Pt tearful during assessment d/t pain
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
## 2021-06-17 ENCOUNTER — Encounter (HOSPITAL_COMMUNITY): Payer: Self-pay | Admitting: Medical Oncology

## 2021-06-17 ENCOUNTER — Other Ambulatory Visit: Payer: Self-pay

## 2021-06-17 ENCOUNTER — Ambulatory Visit (HOSPITAL_COMMUNITY)
Admission: EM | Admit: 2021-06-17 | Discharge: 2021-06-17 | Disposition: A | Discharge status: Home / Self Care | Payer: Managed Care, Other (non HMO) | Attending: Medical Oncology | Admitting: Medical Oncology

## 2021-06-17 DIAGNOSIS — H5712 Ocular pain, left eye: Secondary | ICD-10-CM | POA: Diagnosis not present

## 2021-06-17 MED ORDER — POLYMYXIN B-TRIMETHOPRIM 10000-0.1 UNIT/ML-% OP SOLN: 1.0000 [drp] | OPHTHALMIC | 0 refills | Status: DC

## 2021-06-17 NOTE — ED Provider Notes (Signed)
[facility]    CSN: [id number] Arrival date & time: 06/17/21  1922      History   Chief Complaint Chief Complaint  Patient presents with   Eye Problem    HPI CAPRECIA DEWBERRY is a 50 y.o. female.   HPI   Eye Problem: Patient states that last Sunday she was surprised by her family and was crying a lot and blotting her eyes often with tissues.  Soon after she developed an irritation of her lower left eyelid.  She has noticed some swelling of the area 2.  No discharge, fevers, pain of the eye.  She has used over-the-counter allergy and saline eyedrops without much relief.    Past Medical History:  Diagnosis Date   Heart murmur    childhood   Hypertension    Migraine    Migraine aura, persistent, intractable, with status migrainosus 01/13/2020   Migraine without aura, with intractable migraine, so stated, without mention of status migrainosus 04/09/2014   Other and unspecified ovarian cysts     Patient Active Problem List   Diagnosis Date Noted   Migraine aura, persistent, intractable, with status migrainosus 01/13/2020   Primary snoring 03/29/2017   Chest pain 12/29/2016   History of essential hypertension 12/29/2016   Elevated troponin 12/29/2016   S/P hysterectomy 06/01/2016   Migraine without aura, with intractable migraine, so stated, without mention of status migrainosus 04/09/2014    Past Surgical History:  Procedure Laterality Date   BREAST SURGERY     DIAGNOSTIC LAPAROSCOPY     ectopic   DILATION AND CURETTAGE OF UTERUS     MYOMECTOMY     ROBOTIC ASSISTED TOTAL HYSTERECTOMY WITH SALPINGECTOMY Bilateral 06/01/2016   Procedure: ROBOTIC ASSISTED TOTAL HYSTERECTOMY WITH BILATERAL SALPINGECTOMY;  Surgeon: Servando Salina, MD;  Location: [facility];  Service: Gynecology;  Laterality: Bilateral;    OB History   No obstetric history on file.      Home Medications    Prior to Admission medications   Medication Sig Start Date End Date Taking?  Authorizing Provider  amLODipine (NORVASC) 10 MG tablet Take 10 mg by mouth daily.   Yes [provider]  Galcanezumab-gnlm (EMGALITY) 120 MG/ML SOAJ Inject 120 mg into the skin every 30 (thirty) days. 07/05/20  Yes Suzzanne Cloud, NP  Multiple Vitamins-Minerals (MULTIVITAMIN ADULT PO) Take by mouth.   Yes [provider]  ondansetron (ZOFRAN) 4 MG tablet Take 4 mg by mouth every 8 (eight) hours as needed for nausea or vomiting.   Yes [provider]  pantoprazole (PROTONIX) 40 MG tablet Take 40 mg by mouth daily. Pt takes as needed   Yes [provider]  promethazine-dextromethorphan (PROMETHAZINE-DM) 6.25-15 MG/5ML syrup Take 5 mLs by mouth 4 (four) times daily as needed for cough. 11/30/20  Yes Volney American, PA-C  tiZANidine (ZANAFLEX) 4 MG tablet Take 4 mg by mouth every 6 (six) hours as needed for muscle spasms.   Yes [provider]  valACYclovir (VALTREX) 500 MG tablet Take 500 mg by mouth.   Yes [provider]  butalbital-acetaminophen-caffeine (FIORICET) 50-325-40 MG tablet Take 0.5-1 tablets by mouth 2 (two) times daily as needed for headache or migraine.    [provider]  diclofenac (CATAFLAM) 50 MG tablet Take 1 tablet (50 mg total) by mouth 3 (three) times daily as needed. 01/13/20   Kathrynn Ducking, MD    Family History Family History  Problem Relation Age of Onset   Hypertension Mother  Diabetes Mother    Migraines Mother    Berenice Primas' disease Mother    Hypertension Father    Cancer Father    Kidney cancer Father    Breast cancer Sister    Hypertension Sister    Migraines Sister     Social History Social History   Tobacco Use   Smoking status: Never   Smokeless tobacco: Never  Substance Use Topics   Alcohol use: Yes    Comment: occ   Drug use: No     Allergies   Contrast media [iodinated diagnostic agents], Gadolinium derivatives, Levaquin [levofloxacin in d5w], Other, and Tessalon  [benzonatate]   Review of Systems Review of Systems  As stated above in HPI Physical Exam Triage Vital Signs ED Triage Vitals [06/17/21 1934]  Enc Vitals Group     BP 140/85     Pulse Rate 62     Resp 20     Temp 99 F (37.2 C)     Temp src      SpO2 100 %     Weight      Height      Head Circumference      Peak Flow      Pain Score 8     Pain Loc      Pain Edu?      Excl. in [place]?    No data found.  Updated Vital Signs BP 140/85   Pulse 62   Temp 99 F (37.2 C)   Resp 20   LMP 05/31/2016   SpO2 100%   Physical Exam Vitals and nursing note reviewed.  Constitutional:      General: She is not in acute distress.    Appearance: Normal appearance. She is not ill-appearing, toxic-appearing or diaphoretic.  HENT:     Head: Normocephalic and atraumatic.     Nose: Nose normal. No congestion or rhinorrhea.     Mouth/Throat:     Mouth: Mucous membranes are moist.  Eyes:     Extraocular Movements: Extraocular movements intact.     Pupils: Pupils are equal, round, and reactive to light.     Comments: Left lower eyelid with stye near the tearduct. No erythema of conjunctiva or eye. No discharge  Neurological:     Mental Status: She is alert.     UC Treatments / Results  Labs (all labs ordered are listed, but only abnormal results are displayed) Labs Reviewed - No data to display  EKG   Radiology No results found.  Procedures Procedures (including critical care time)  Medications Ordered in UC Medications - No data to display  Initial Impression / Assessment and Plan / UC Course  I have reviewed the triage vital signs and the nursing notes.  Pertinent labs & imaging results that were available during my care of the patient were reviewed by me and considered in my medical decision making (see chart for details).     New.  She has a sizable stye that is near the tear duct.  There also appears to be some inflammation around this area so were going to treat  her with antibiotic drops at the moment to ensure that this does not progress to dacryocystitis.  Discussed red flag signs and symptoms. Final Clinical Impressions(s) / UC Diagnoses   Final diagnoses:  None   Discharge Instructions   None    ED Prescriptions   None    PDMP not reviewed this encounter.   Hughie Closs, Hershal Coria 06/17/21  2008  

## 2021-06-17 NOTE — ED Triage Notes (Signed)
Pt reports eye swelling started last Sunday.

## 2021-06-25 ENCOUNTER — Encounter (HOSPITAL_COMMUNITY): Payer: Self-pay

## 2021-06-25 ENCOUNTER — Ambulatory Visit (HOSPITAL_COMMUNITY)
Admission: EM | Admit: 2021-06-25 | Discharge: 2021-06-25 | Disposition: A | Discharge status: Home / Self Care | Payer: Managed Care, Other (non HMO) | Attending: Family Medicine | Admitting: Family Medicine

## 2021-06-25 DIAGNOSIS — R21 Rash and other nonspecific skin eruption: Secondary | ICD-10-CM

## 2021-06-25 MED ORDER — TRIAMCINOLONE ACETONIDE 0.1 % EX CREA: 1.0000 "application " | TOPICAL_CREAM | Freq: Two times a day (BID) | CUTANEOUS | 0 refills | Status: AC

## 2021-06-25 MED ORDER — PREDNISONE 20 MG PO TABS: 40.0000 mg | ORAL_TABLET | Freq: Every day | ORAL | 0 refills | Status: DC

## 2021-06-25 NOTE — ED Provider Notes (Signed)
[place]   SX:[id number] 06/25/21 Arrival Time: [id number]  ASSESSMENT & PLAN:  1. Rash and nonspecific skin eruption    No signs of bacterial infection.  Begin trial of: Meds ordered this encounter  Medications   predniSONE (DELTASONE) 20 MG tablet    Sig: Take 2 tablets (40 mg total) by mouth daily.    Dispense:  10 tablet    Refill:  0   triamcinolone cream (KENALOG) 0.1 %    Sig: Apply 1 application topically 2 (two) times daily.    Dispense:  30 g    Refill:  0   Will follow up with PCP or here if worsening or failing to improve as anticipated. Reviewed expectations re: course of current medical issues. Questions answered. Outlined signs and symptoms indicating need for more acute intervention. Patient verbalized understanding. After Visit Summary given.   SUBJECTIVE:  Kelli Tran is a 50 y.o. female who presents with a skin complaint. Itchy rash over extremities mainly; noted sev days ago. Working in yard last week; ques relation. Has been putting OTC antibiotic ointment on; not much help. Newer areas on inner thighs.    OBJECTIVE: Vitals:   06/25/21 1209  BP: (!) 146/96  Pulse: 75  Resp: 18  Temp: 97.9 F (36.6 C)  TempSrc: Oral  SpO2: 100%    General appearance: alert; no distress HEENT: [place]; AT Neck: supple with FROM Lungs: clear to auscultation bilaterally Heart: regular rate and rhythm Extremities: no edema; moves all extremities normally Skin: warm and dry; scattered erythematous papules over extremities; no areas of drainage or bleeding Psychological: alert and cooperative; normal mood and affect  Allergies  Allergen Reactions   Contrast Media [Iodinated Diagnostic Agents] Nausea Only and Other (See Comments)    Caused burning.   Gadolinium Derivatives Nausea Only and Other (See Comments)    CAUSED BURNING   Levaquin [Levofloxacin In D5w] Other (See Comments)    "inflammation" burning sensation, generalized   Other Swelling     Walnuts causes swelling of the tongue.   Tessalon [Benzonatate] Swelling    Past Medical History:  Diagnosis Date   Heart murmur    childhood   Hypertension    Migraine    Migraine aura, persistent, intractable, with status migrainosus 01/13/2020   Migraine without aura, with intractable migraine, so stated, without mention of status migrainosus 04/09/2014   Other and unspecified ovarian cysts    Social History   Socioeconomic History   Marital status: Divorced    Spouse name: Not on file   Number of children: 2   Years of education: Grad   Highest education level: Not on file  Occupational History   Occupation: Tax inspector: [employer]  Tobacco Use   Smoking status: Never   Smokeless tobacco: Never  Substance and Sexual Activity   Alcohol use: Yes    Comment: occ   Drug use: No   Sexual activity: Not Currently  Other Topics Concern   Not on file  Social History Narrative   Drinks 1 cup of coffee a day    Social Determinants of Health   Financial Resource Strain: Not on file  Food Insecurity: Not on file  Transportation Needs: Not on file  Physical Activity: Not on file  Stress: Not on file  Social Connections: Not on file  Intimate Partner Violence: Not on file   Family History  Problem Relation Age of Onset   Hypertension Mother    Diabetes  Mother    Migraines Mother    Berenice Primas' disease Mother    Hypertension Father    Cancer Father    Kidney cancer Father    Breast cancer Sister    Hypertension Sister    Migraines Sister    Past Surgical History:  Procedure Laterality Date   BREAST SURGERY     DIAGNOSTIC LAPAROSCOPY     ectopic   DILATION AND CURETTAGE OF UTERUS     MYOMECTOMY     ROBOTIC ASSISTED TOTAL HYSTERECTOMY WITH SALPINGECTOMY Bilateral 06/01/2016   Procedure: ROBOTIC ASSISTED TOTAL HYSTERECTOMY WITH BILATERAL SALPINGECTOMY;  Surgeon: Servando Salina, MD;  Location: [facility];  Service: Gynecology;  Laterality: Bilateral;       Vanessa Kick, MD 06/25/21 1247

## 2021-06-25 NOTE — ED Triage Notes (Signed)
Pt reports she started having a rash with black spots full of fluid in the right arm around 06/25/2021. Reports the rash spread to legs and back. States the rash is itchy. Benadryl and antibiotic ointment gives so relief.

## 2021-10-09 ENCOUNTER — Other Ambulatory Visit: Payer: Self-pay

## 2021-10-09 ENCOUNTER — Encounter (HOSPITAL_COMMUNITY): Payer: Self-pay | Admitting: Emergency Medicine

## 2021-10-09 ENCOUNTER — Ambulatory Visit (HOSPITAL_COMMUNITY)
Admission: EM | Admit: 2021-10-09 | Discharge: 2021-10-09 | Disposition: A | Discharge status: Home / Self Care | Payer: Managed Care, Other (non HMO) | Attending: Emergency Medicine | Admitting: Emergency Medicine

## 2021-10-09 DIAGNOSIS — R058 Other specified cough: Secondary | ICD-10-CM

## 2021-10-09 DIAGNOSIS — R519 Headache, unspecified: Secondary | ICD-10-CM

## 2021-10-09 DIAGNOSIS — Z20822 Contact with and (suspected) exposure to covid-19: Secondary | ICD-10-CM | POA: Diagnosis present

## 2021-10-09 DIAGNOSIS — J029 Acute pharyngitis, unspecified: Secondary | ICD-10-CM | POA: Diagnosis not present

## 2021-10-09 DIAGNOSIS — U071 COVID-19: Secondary | ICD-10-CM | POA: Insufficient documentation

## 2021-10-09 LAB — POC INFLUENZA A AND B ANTIGEN (URGENT CARE ONLY)
INFLUENZA A ANTIGEN, POC: NEGATIVE
INFLUENZA B ANTIGEN, POC: NEGATIVE

## 2021-10-09 MED ORDER — MOLNUPIRAVIR 200 MG PO CAPS: 4.0000 | ORAL_CAPSULE | Freq: Two times a day (BID) | ORAL | 0 refills | Status: AC

## 2021-10-09 NOTE — ED Triage Notes (Signed)
Pt presents with cough, headache, and ear pain that started over night.

## 2021-10-09 NOTE — Discharge Instructions (Addendum)
Due to your known exposure to COVID-19, it is appropriate to begin you on antiviral medication for this.  I have sent a prescription for Molnupiravir to your pharmacy, please take all as prescribed, even if your COVID test is negative.    Your COVID-19 test result will be provided to you as soon as is received, initially will be posted to your MyChart but if it is positive you will also be contacted by phone.  I recommend that your "pod" of people quarantine together until everyone's COVID tests have been resulted.  If any of you are positive, he will need to quarantine for at least 3 days.  Your influenza test today is negative.

## 2021-10-09 NOTE — ED Provider Notes (Signed)
[facility]    CSN: [id number] Arrival date & time: 10/09/21  1017    HISTORY   Chief Complaint  Patient presents with   Headache   Otalgia   Cough   HPI Kelli Tran is a 50 y.o. female. Patient reports rapid onset of nonproductive cough cough, persistent severe headache, body ache, sore throat and mild ear pain, states symptoms began last night.  Patient has a mildly elevated temperature on arrival today, vital signs are otherwise normal.  Patient states she just got back from [place] visiting her family, states her nephew with whom she spent a significant amount of time over the holidays tested positive for COVID-19 yesterday.  Patient states has not tried any medication for symptoms at this time.  The history is provided by the patient.  Past Medical History:  Diagnosis Date   Heart murmur    childhood   Hypertension    Migraine    Migraine aura, persistent, intractable, with status migrainosus 01/13/2020   Migraine without aura, with intractable migraine, so stated, without mention of status migrainosus 04/09/2014   Other and unspecified ovarian cysts    Patient Active Problem List   Diagnosis Date Noted   Migraine aura, persistent, intractable, with status migrainosus 01/13/2020   Primary snoring 03/29/2017   Chest pain 12/29/2016   History of essential hypertension 12/29/2016   Elevated troponin 12/29/2016   S/P hysterectomy 06/01/2016   Migraine without aura, with intractable migraine, so stated, without mention of status migrainosus 04/09/2014   Past Surgical History:  Procedure Laterality Date   BREAST SURGERY     DIAGNOSTIC LAPAROSCOPY     ectopic   DILATION AND CURETTAGE OF UTERUS     MYOMECTOMY     ROBOTIC ASSISTED TOTAL HYSTERECTOMY WITH SALPINGECTOMY Bilateral 06/01/2016   Procedure: ROBOTIC ASSISTED TOTAL HYSTERECTOMY WITH BILATERAL SALPINGECTOMY;  Surgeon: Servando Salina, MD;  Location: [facility];  Service: Gynecology;  Laterality:  Bilateral;   OB History   No obstetric history on file.    Home Medications    Prior to Admission medications   Medication Sig Start Date End Date Taking? Authorizing Provider  molnupiravir EUA (LAGEVRIO) 200 MG CAPS capsule Take 4 capsules (800 mg total) by mouth 2 (two) times daily for 5 days. 10/09/21 10/14/21 Yes Lynden Oxford Scales, PA-C  amLODipine (NORVASC) 10 MG tablet Take 10 mg by mouth daily.    [provider]  Galcanezumab-gnlm (EMGALITY) 120 MG/ML SOAJ Inject 120 mg into the skin every 30 (thirty) days. 07/05/20   Suzzanne Cloud, NP  Multiple Vitamins-Minerals (MULTIVITAMIN ADULT PO) Take by mouth.    [provider]  ondansetron (ZOFRAN) 4 MG tablet Take 4 mg by mouth every 8 (eight) hours as needed for nausea or vomiting.    [provider]  pantoprazole (PROTONIX) 40 MG tablet Take 40 mg by mouth daily. Pt takes as needed    [provider]  pravastatin (PRAVACHOL) 10 MG tablet Take 10 mg by mouth at bedtime. 09/28/21   [provider]  predniSONE (DELTASONE) 20 MG tablet Take 2 tablets (40 mg total) by mouth daily. 06/25/21   Vanessa Kick, MD  promethazine-dextromethorphan (PROMETHAZINE-DM) 6.25-15 MG/5ML syrup Take 5 mLs by mouth 4 (four) times daily as needed for cough. 11/30/20   Volney American, PA-C  tiZANidine (ZANAFLEX) 4 MG tablet Take 4 mg by mouth every 6 (six) hours as needed for muscle spasms.    [provider]  triamcinolone cream (KENALOG) 0.1 %  Apply 1 application topically 2 (two) times daily. 06/25/21   Vanessa Kick, MD  trimethoprim-polymyxin b (POLYTRIM) ophthalmic solution Place 1 drop into the left eye every 4 (four) hours. 06/17/21   Hughie Closs, PA-C  valACYclovir (VALTREX) 500 MG tablet Take 500 mg by mouth.    [provider]   Family History Family History  Problem Relation Age of Onset   Hypertension Mother    Diabetes Mother    Migraines Mother    Berenice Primas' disease  Mother    Hypertension Father    Cancer Father    Kidney cancer Father    Breast cancer Sister    Hypertension Sister    Migraines Sister    Social History Social History   Tobacco Use   Smoking status: Never   Smokeless tobacco: Never  Substance Use Topics   Alcohol use: Yes    Comment: occ   Drug use: No   Allergies   Contrast media [iodinated diagnostic agents], Gadolinium derivatives, Levaquin [levofloxacin in d5w], Other, and Tessalon [benzonatate]  Review of Systems Review of Systems Pertinent findings noted in history of present illness.   Physical Exam Triage Vital Signs ED Triage Vitals  Enc Vitals Group     BP 09/09/21 0827 (!) 147/82     Pulse Rate 09/09/21 0827 72     Resp 09/09/21 0827 18     Temp 09/09/21 0827 98.3 F (36.8 C)     Temp Source 09/09/21 0827 Oral     SpO2 09/09/21 0827 98 %     Weight --      Height --      Head Circumference --      Peak Flow --      Pain Score 09/09/21 0826 5     Pain Loc --      Pain Edu? --      Excl. in [place]? --   No data found.  Updated Vital Signs BP (!) 146/95 (BP Location: Left Arm)   Pulse 69   Temp 99 F (37.2 C) (Oral)   Resp 17   LMP 05/31/2016   SpO2 96%   Physical Exam Vitals and nursing note reviewed.  Constitutional:      General: She is not in acute distress.    Appearance: Normal appearance. She is not ill-appearing.  HENT:     Head: Normocephalic and atraumatic.     Salivary Glands: Right salivary gland is not diffusely enlarged or tender. Left salivary gland is not diffusely enlarged or tender.     Right Ear: Tympanic membrane, ear canal and external ear normal. No drainage. No middle ear effusion. There is no impacted cerumen. Tympanic membrane is not erythematous or bulging.     Left Ear: Tympanic membrane, ear canal and external ear normal. No drainage.  No middle ear effusion. There is no impacted cerumen. Tympanic membrane is not erythematous or bulging.     Nose: Nose normal. No  nasal deformity, septal deviation, mucosal edema, congestion or rhinorrhea.     Right Turbinates: Not enlarged, swollen or pale.     Left Turbinates: Not enlarged, swollen or pale.     Right Sinus: No maxillary sinus tenderness or frontal sinus tenderness.     Left Sinus: No maxillary sinus tenderness or frontal sinus tenderness.     Mouth/Throat:     Lips: Pink. No lesions.     Mouth: Mucous membranes are moist. No oral lesions.     Pharynx: Oropharynx is clear.  Uvula midline. Posterior oropharyngeal erythema present. No uvula swelling.     Tonsils: No tonsillar exudate. 0 on the right. 0 on the left.  Eyes:     General: Lids are normal.        Right eye: No discharge.        Left eye: No discharge.     Extraocular Movements: Extraocular movements intact.     Conjunctiva/sclera: Conjunctivae normal.     Right eye: Right conjunctiva is not injected.     Left eye: Left conjunctiva is not injected.  Neck:     Trachea: Trachea and phonation normal.  Cardiovascular:     Rate and Rhythm: Normal rate and regular rhythm.     Pulses: Normal pulses.     Heart sounds: Normal heart sounds. No murmur heard.   No friction rub. No gallop.  Pulmonary:     Effort: Pulmonary effort is normal. No accessory muscle usage, prolonged expiration or respiratory distress.     Breath sounds: Normal breath sounds. No stridor, decreased air movement or transmitted upper airway sounds. No decreased breath sounds, wheezing, rhonchi or rales.     Comments: Breath sounds turbulent throughout Chest:     Chest wall: No tenderness.  Musculoskeletal:        General: Normal range of motion.     Cervical back: Normal range of motion and neck supple. Normal range of motion.  Lymphadenopathy:     Cervical: Cervical adenopathy present.     Right cervical: Superficial cervical adenopathy present.     Left cervical: Superficial cervical adenopathy present.  Skin:    General: Skin is warm and dry.     Findings: No  erythema or rash.  Neurological:     General: No focal deficit present.     Mental Status: She is alert and oriented to person, place, and time.  Psychiatric:        Mood and Affect: Mood normal.        Behavior: Behavior normal.    Visual Acuity Right Eye Distance:   Left Eye Distance:   Bilateral Distance:    Right Eye Near:   Left Eye Near:    Bilateral Near:     UC Couse / Diagnostics / Procedures:    EKG  Radiology No results found.  Procedures Procedures (including critical care time)  UC Diagnoses / Final Clinical Impressions(s)   I have reviewed the triage vital signs and the nursing notes.  Pertinent labs & imaging results that were available during my care of the patient were reviewed by me and considered in my medical decision making (see chart for details).   Final diagnoses:  Close exposure to COVID-19 virus  Acute nonintractable headache, unspecified headache type  Nonproductive cough  Acute pharyngitis, unspecified etiology   Rapidly test today is negative.  Due to patient's known prolonged, close exposure to COVID-19, is appropriate for her to begin taking antiviral medication at this time.  Prescription sent to her pharmacy.  Quarantine precautions advised.  Return precautions advised.  Disposition Upon Discharge:  Condition: stable for discharge home Home: take medications as prescribed; routine discharge instructions as discussed; follow up as advised.  Patient presented with an acute illness with associated systemic symptoms and significant discomfort requiring urgent management. In my opinion, this is a condition that a prudent lay person (someone who possesses an average knowledge of health and medicine) may potentially expect to result in complications if not addressed urgently such as respiratory distress, impairment  of bodily function or dysfunction of bodily organs.   Routine symptom specific, illness specific and/or disease specific  instructions were discussed with the patient and/or caregiver at length.   As such, the patient has been evaluated and assessed, work-up was performed and treatment was provided in alignment with urgent care protocols and evidence based medicine.  Patient/parent/caregiver has been advised that the patient may require follow up for further testing and treatment if the symptoms continue in spite of treatment, as clinically indicated and appropriate.  The patient was tested for COVID-19, Influenza and/or RSV, then the patient/parent/guardian was advised to isolate at home pending the results of his/her diagnostic coronavirus test and potentially longer if they're positive. I have also advised pt that if his/her COVID-19 test returns positive, it's recommended to self-isolate for at least 10 days after symptoms first appeared AND until fever-free for 24 hours without fever reducer AND other symptoms have improved or resolved. Discussed self-isolation recommendations as well as instructions for household member/close contacts as per the [facility] and [place] [facility], and also gave patient the [facility] packet with this information.  Patient/parent/caregiver has been advised to return to the [facility] or PCP in 3-5 days if no better; to PCP or the Emergency Department if new signs and symptoms develop, or if the current signs or symptoms continue to change or worsen for further workup, evaluation and treatment as clinically indicated and appropriate  The patient will follow up with their current PCP if and as advised. If the patient does not currently have a PCP we will assist them in obtaining one.   The patient may need specialty follow up if the symptoms continue, in spite of conservative treatment and management, for further workup, evaluation, consultation and treatment as clinically indicated and appropriate.  Patient/parent/caregiver verbalized understanding and agreement of plan as discussed.  All questions were addressed  during visit.  Please see discharge instructions below for further details of plan.  ED Prescriptions     Medication Sig Dispense Auth. Provider   molnupiravir EUA (LAGEVRIO) 200 MG CAPS capsule Take 4 capsules (800 mg total) by mouth 2 (two) times daily for 5 days. 40 capsule Lynden Oxford Scales, PA-C      PDMP not reviewed this encounter.  Pending results:  Labs Reviewed  SARS CORONAVIRUS 2 (TAT 6-24 HRS)  POC INFLUENZA A AND B ANTIGEN (URGENT CARE ONLY)    Medications Ordered in UC: Medications - No data to display  Discharge Instructions:   Discharge Instructions      Due to your known exposure to COVID-19, it is appropriate to begin you on antiviral medication for this.  I have sent a prescription for Molnupiravir to your pharmacy, please take all as prescribed, even if your COVID test is negative.    Your COVID-19 test result will be provided to you as soon as is received, initially will be posted to your MyChart but if it is positive you will also be contacted by phone.  I recommend that your "pod" of people quarantine together until everyone's COVID tests have been resulted.  If any of you are positive, he will need to quarantine for at least 3 days.  Your influenza test today is negative.        Lynden Oxford Scales, PA-C 10/09/21 1203

## 2021-10-10 ENCOUNTER — Telehealth: Payer: Self-pay | Admitting: Emergency Medicine

## 2021-10-10 LAB — SARS CORONAVIRUS 2 (TAT 6-24 HRS): SARS Coronavirus 2: POSITIVE — AB

## 2021-10-10 MED ORDER — PROMETHAZINE-DM 6.25-15 MG/5ML PO SYRP: 5.0000 mL | ORAL_SOLUTION | Freq: Four times a day (QID) | ORAL | 0 refills | Status: AC | PRN

## 2021-10-10 NOTE — Telephone Encounter (Signed)
Patient exposed to COVID-19, complains of cough, requesting cough medication.  Patient reports history of allergy to Benzonatate, has tolerated Promethazine DM well in the past, last prescribed January 2022.

## 2021-12-13 ENCOUNTER — Emergency Department (HOSPITAL_COMMUNITY)
Admission: EM | Admit: 2021-12-13 | Discharge: 2021-12-13 | Disposition: A | Discharge status: Home / Self Care | Payer: Managed Care, Other (non HMO) | Attending: Emergency Medicine | Admitting: Emergency Medicine

## 2021-12-13 ENCOUNTER — Ambulatory Visit (HOSPITAL_COMMUNITY)
Admission: EM | Admit: 2021-12-13 | Discharge: 2021-12-13 | Disposition: A | Discharge status: Home / Self Care | Payer: Managed Care, Other (non HMO) | Attending: Internal Medicine | Admitting: Internal Medicine

## 2021-12-13 ENCOUNTER — Other Ambulatory Visit: Payer: Self-pay

## 2021-12-13 ENCOUNTER — Encounter (HOSPITAL_COMMUNITY): Payer: Self-pay

## 2021-12-13 ENCOUNTER — Emergency Department (HOSPITAL_COMMUNITY): Payer: Managed Care, Other (non HMO)

## 2021-12-13 DIAGNOSIS — R0789 Other chest pain: Secondary | ICD-10-CM | POA: Diagnosis not present

## 2021-12-13 DIAGNOSIS — R079 Chest pain, unspecified: Secondary | ICD-10-CM

## 2021-12-13 DIAGNOSIS — I10 Essential (primary) hypertension: Secondary | ICD-10-CM | POA: Diagnosis not present

## 2021-12-13 DIAGNOSIS — Z79899 Other long term (current) drug therapy: Secondary | ICD-10-CM | POA: Diagnosis not present

## 2021-12-13 LAB — BASIC METABOLIC PANEL
Anion gap: 11 (ref 5–15)
BUN: 7 mg/dL (ref 6–20)
CO2: 25 mmol/L (ref 22–32)
Calcium: 9.4 mg/dL (ref 8.9–10.3)
Chloride: 102 mmol/L (ref 98–111)
Creatinine, Ser: 0.67 mg/dL (ref 0.44–1.00)
GFR, Estimated: >60 mL/min (ref >60)
Glucose, Bld: 94 mg/dL (ref 70–99)
Potassium: 3.7 mmol/L (ref 3.5–5.1)
Sodium: 138 mmol/L (ref 135–145)

## 2021-12-13 LAB — CBC
HCT: 40 % (ref 36.0–46.0)
Hemoglobin: 14.1 g/dL (ref 12.0–15.0)
MCH: 28.5 pg (ref 26.0–34.0)
MCHC: 35.3 g/dL (ref 30.0–36.0)
MCV: 81 fL (ref 80.0–100.0)
Platelets: 297 10*3/uL (ref 150–400)
RBC: 4.94 MIL/uL (ref 3.87–5.11)
RDW: 13.2 % (ref 11.5–15.5)
WBC: 4.7 10*3/uL (ref 4.0–10.5)
nRBC: 0 % (ref 0.0–0.2)

## 2021-12-13 LAB — TROPONIN I (HIGH SENSITIVITY)
Troponin I (High Sensitivity): <2 ng/L (ref <18)
Troponin I (High Sensitivity): <2 ng/L (ref <18)

## 2021-12-13 LAB — D-DIMER, QUANTITATIVE: D-Dimer, Quant: 0.37 ug/mL-FEU (ref 0.00–0.50)

## 2021-12-13 MED ORDER — FAMOTIDINE 20 MG PO TABS
20.0000 mg | ORAL_TABLET | Freq: Once | ORAL | Status: AC
  Administered 2021-12-13: 20 mg via ORAL
  Filled 2021-12-13: qty 1

## 2021-12-13 MED ORDER — ACETAMINOPHEN 500 MG PO TABS
1000.0000 mg | ORAL_TABLET | Freq: Once | ORAL | Status: AC
  Administered 2021-12-13: 1000 mg via ORAL
  Filled 2021-12-13: qty 2

## 2021-12-13 MED ORDER — DIPHENHYDRAMINE HCL 50 MG/ML IJ SOLN
50.0000 mg | Freq: Once | INTRAMUSCULAR | Status: AC
  Administered 2021-12-13: 50 mg via INTRAVENOUS
  Filled 2021-12-13: qty 1

## 2021-12-13 NOTE — ED Notes (Signed)
[facility] notified for transport 

## 2021-12-13 NOTE — Discharge Instructions (Signed)
Take your blood pressure meds as directed.  Take Tylenol every 4 hours needed for pain.  Follow-up closely with cardiology to discuss repeat stress test.

## 2021-12-13 NOTE — ED Notes (Signed)
Report given to [place], CN at [facility]. [facility] here for transport.

## 2021-12-13 NOTE — Discharge Instructions (Signed)
Patient is advised to go to the emergency department for chest pain work-up.

## 2021-12-13 NOTE — ED Provider Triage Note (Signed)
Emergency Medicine Provider Triage Evaluation Note  TANI VIRGO , a 51 y.o. female  was evaluated in triage.  Pt complains of chest pain. She states that same began on Sunday, is located on the left side of her chest and is worse with walking. Also endorses jaw pain and some nausea. Took ASA and SL NG without relief. Also with migraine that also started on Sunday  Review of Systems  Positive:  Negative: See above  Physical Exam  BP (!) 146/93 (BP Location: Left Arm)    Pulse 71    Temp 98.4 F (36.9 C) (Oral)    Resp 16    LMP 05/31/2016    SpO2 97%  Gen:   Awake, no distress   Resp:  Normal effort  MSK:   Moves extremities without difficulty  Other:    Medical Decision Making  Medically screening exam initiated at 10:40 AM.  Appropriate orders placed.  Janessa Mickle [place] was informed that the remainder of the evaluation will be completed by another provider, this initial triage assessment does not replace that evaluation, and the importance of remaining in the ED until their evaluation is complete.     Bud Face, PA-C 12/13/21 1043

## 2021-12-13 NOTE — ED Provider Notes (Signed)
Patient seen after prior EDP.  Patient feels improved.  Work-up in ED is without evidence of significant acute disease.  Patient does understand need for close follow-up.  Strict return precautions given and understood.   Valarie Merino, MD 12/13/21 [phone]

## 2021-12-13 NOTE — ED Provider Notes (Signed)
[facility] EMERGENCY DEPARTMENT Provider Note   CSN: [id number] Arrival date & time: 12/13/21  5361     History  Chief Complaint  Patient presents with   Chest Pain    Kelli Tran is a 51 y.o. female.  Patient presents with history of high blood pressure, recently started on cholesterol meds and has had central chest pain radiating to left side pretty constant since Sunday.  Nothing specifically worsens.  Patient is also had migraine-like headache similar to previous gradually developing with the pain throughout today.  Patient given 1 nitro from urgent care without relief on route.  No exertional component.  Patient feels more winded with walking but currently no shortness of breath.  Patient denies blood clot risk factors.  Patient has stress test February 2018 which was unremarkable.      Home Medications Prior to Admission medications   Medication Sig Start Date End Date Taking? Authorizing Provider  amLODipine (NORVASC) 5 MG tablet Take 5 mg by mouth every evening. 12/12/21  Yes [provider]  Multiple Vitamins-Minerals (MULTIVITAMIN ADULT PO) Take 1 tablet by mouth daily.   Yes [provider]  pravastatin (PRAVACHOL) 10 MG tablet Take 10 mg by mouth at bedtime. 09/28/21  Yes [provider]  tiZANidine (ZANAFLEX) 4 MG capsule Take 4 mg by mouth 3 (three) times daily as needed for muscle spasms. 11/09/21  Yes [provider]  predniSONE (DELTASONE) 20 MG tablet Take 2 tablets (40 mg total) by mouth daily. Patient not taking: Reported on 12/13/2021 06/25/21   Vanessa Kick, MD  triamcinolone cream (KENALOG) 0.1 % Apply 1 application topically 2 (two) times daily. Patient not taking: Reported on 12/13/2021 06/25/21   Vanessa Kick, MD  trimethoprim-polymyxin b (POLYTRIM) ophthalmic solution Place 1 drop into the left eye every 4 (four) hours. Patient not taking: Reported on 12/13/2021 06/17/21   Hughie Closs, PA-C       Allergies    Contrast media [iodinated contrast media], Gadolinium derivatives, Levaquin [levofloxacin in d5w], Other, and Tessalon [benzonatate]    Review of Systems   Review of Systems  Constitutional:  Negative for chills and fever.  HENT:  Negative for congestion.   Eyes:  Negative for visual disturbance.  Respiratory:  Negative for shortness of breath.   Cardiovascular:  Positive for chest pain.  Gastrointestinal:  Negative for abdominal pain and vomiting.  Genitourinary:  Negative for dysuria and flank pain.  Musculoskeletal:  Negative for back pain, neck pain and neck stiffness.  Skin:  Negative for rash.  Neurological:  Positive for headaches. Negative for light-headedness.   Physical Exam Updated Vital Signs BP (!) 135/93    Pulse 62    Temp 98 F (36.7 C) (Oral)    Resp 15    LMP 05/31/2016    SpO2 99%  Physical Exam Vitals and nursing note reviewed.  Constitutional:      General: She is not in acute distress.    Appearance: She is well-developed.  HENT:     Head: Normocephalic and atraumatic.     Mouth/Throat:     Mouth: Mucous membranes are moist.  Eyes:     General:        Right eye: No discharge.        Left eye: No discharge.     Conjunctiva/sclera: Conjunctivae normal.  Neck:     Trachea: No tracheal deviation.  Cardiovascular:     Rate and Rhythm: Normal rate and regular rhythm.  Pulses:          Radial pulses are 2+ on the right side and 2+ on the left side.     Heart sounds: No murmur heard. Pulmonary:     Effort: Pulmonary effort is normal.     Breath sounds: Normal breath sounds.  Abdominal:     General: There is no distension.     Palpations: Abdomen is soft.     Tenderness: There is no abdominal tenderness. There is no guarding.  Musculoskeletal:     Cervical back: Normal range of motion and neck supple. No rigidity.     Right lower leg: No tenderness. No edema.     Left lower leg: No tenderness. No edema.  Skin:    General: Skin is  warm.     Capillary Refill: Capillary refill takes less than 2 seconds.     Findings: No rash.  Neurological:     General: No focal deficit present.     Mental Status: She is alert.     Cranial Nerves: No cranial nerve deficit.  Psychiatric:        Mood and Affect: Mood normal.    ED Results / Procedures / Treatments   Labs (all labs ordered are listed, but only abnormal results are displayed) Labs Reviewed  BASIC METABOLIC PANEL  CBC  D-DIMER, QUANTITATIVE  TROPONIN I (HIGH SENSITIVITY)  TROPONIN I (HIGH SENSITIVITY)    EKG EKG Interpretation  Date/Time:  Tuesday December 13 2021 09:58:38 EST Ventricular Rate:  64 PR Interval:  142 QRS Duration: 88 QT Interval:  424 QTC Calculation: 437 R Axis:   63 Text Interpretation: Normal sinus rhythm Nonspecific ST and T wave abnormality Abnormal ECG When compared with ECG of 13-Dec-2021 09:14, PREVIOUS ECG IS PRESENT Confirmed by Dene Gentry [phone]) on 12/13/2021 3:47:25 PM  Radiology DG Chest 2 View  Result Date: 12/13/2021 CLINICAL DATA:  Chest pain EXAM: CHEST - 2 VIEW COMPARISON:  Chest radiograph dated Apr 11, 2021 FINDINGS: The heart size and mediastinal contours are within normal limits. Both lungs are clear. The visualized skeletal structures are unremarkable. IMPRESSION: No active cardiopulmonary disease. Electronically Signed   By: Keane Police D.O.   On: 12/13/2021 10:30    Procedures Procedures    Medications Ordered in ED Medications  acetaminophen (TYLENOL) tablet 1,000 mg (has no administration in time range)  famotidine (PEPCID) tablet 20 mg (has no administration in time range)  diphenhydrAMINE (BENADRYL) injection 50 mg (has no administration in time range)    ED Course/ Medical Decision Making/ A&P                           Medical Decision Making Amount and/or Complexity of Data Reviewed Labs: ordered. Radiology: ordered.  Risk OTC drugs. Prescription drug management.   Patient presents with  persistent chest pressure and discomfort left chest.  Medical records reviewed and patient had negative stress test December 29, 2016.  Differential includes ACS, reflux, hernia, pulm embolism, other.  2 troponins reviewed and negative no signs of STEMI on EKG.  Plan for Pepcid, Tylenol and migraine medicines to help with pain.  Blood pressure mild elevated in the ER patient has known high blood pressure.  General blood work ordered and reviewed no acute abnormalities no signs of significant anemia or electrolyte or kidney abnormalities.  Patient care signed out to reassess and follow-up D-dimer results.        Final Clinical Impression(s) /  ED Diagnoses Final diagnoses:  Acute chest pain  Primary hypertension    Rx / DC Orders ED Discharge Orders     None         Elnora Morrison, MD 12/13/21 1553

## 2021-12-13 NOTE — ED Triage Notes (Signed)
Pt c/o migraine headache behind lt eye since Sunday afternoon and having constant lt sided chest pain radiating up lt side of neck since Sunday night and progressed since. States took OTC headache relief meds with little relief.

## 2021-12-13 NOTE — ED Triage Notes (Signed)
Pt with central chest pain with radiation to L side since Sunday. Took asa containing excedrin for her migraine (hx of same) prior to presentation to UC and then [facility] gave 1 nitro without change in pain.

## 2021-12-13 NOTE — ED Notes (Signed)
Patient is being discharged from the Urgent Care and sent to the Emergency Department via [facility]. Per Dr. Adela Glimpse, patient is in need of higher level of care due to active chest pain. Patient is aware and verbalizes understanding of plan of care.  Vitals:   12/13/21 0906  BP: (!) 170/98  Pulse: 70  Resp: 18  Temp: 98.5 F (36.9 C)  SpO2: 99%

## 2023-06-12 ENCOUNTER — Ambulatory Visit (HOSPITAL_COMMUNITY)
Admission: RE | Admit: 2023-06-12 | Discharge: 2023-06-12 | Disposition: A | Discharge status: Home / Self Care | Payer: Managed Care, Other (non HMO) | Source: Ambulatory Visit | Attending: Internal Medicine | Admitting: Internal Medicine

## 2023-06-12 ENCOUNTER — Encounter (HOSPITAL_COMMUNITY): Payer: Self-pay

## 2023-06-12 VITALS — BP 146/96 | HR 75 | Temp 98.2°F | Resp 18

## 2023-06-12 DIAGNOSIS — R519 Headache, unspecified: Secondary | ICD-10-CM | POA: Insufficient documentation

## 2023-06-12 DIAGNOSIS — I10 Essential (primary) hypertension: Secondary | ICD-10-CM | POA: Diagnosis present

## 2023-06-12 DIAGNOSIS — J069 Acute upper respiratory infection, unspecified: Secondary | ICD-10-CM | POA: Diagnosis present

## 2023-06-12 DIAGNOSIS — U071 COVID-19: Secondary | ICD-10-CM | POA: Diagnosis not present

## 2023-06-12 MED ORDER — METOCLOPRAMIDE HCL 5 MG/ML IJ SOLN
5.0000 mg | Freq: Once | INTRAMUSCULAR | Status: AC
  Administered 2023-06-12: 5 mg via INTRAMUSCULAR

## 2023-06-12 MED ORDER — GUAIFENESIN ER 1200 MG PO TB12: 1200.0000 mg | ORAL_TABLET | Freq: Two times a day (BID) | ORAL | 0 refills | Status: AC

## 2023-06-12 MED ORDER — KETOROLAC TROMETHAMINE 30 MG/ML IJ SOLN
INTRAMUSCULAR | Status: AC
  Filled 2023-06-12: qty 1

## 2023-06-12 MED ORDER — PROMETHAZINE-DM 6.25-15 MG/5ML PO SYRP: 5.0000 mL | ORAL_SOLUTION | Freq: Every evening | ORAL | 0 refills | Status: DC | PRN

## 2023-06-12 MED ORDER — DEXAMETHASONE SODIUM PHOSPHATE 10 MG/ML IJ SOLN
10.0000 mg | Freq: Once | INTRAMUSCULAR | Status: AC
  Administered 2023-06-12: 10 mg via INTRAMUSCULAR

## 2023-06-12 MED ORDER — KETOROLAC TROMETHAMINE 30 MG/ML IJ SOLN
30.0000 mg | Freq: Once | INTRAMUSCULAR | Status: AC
  Administered 2023-06-12: 30 mg via INTRAMUSCULAR

## 2023-06-12 MED ORDER — METOCLOPRAMIDE HCL 5 MG/ML IJ SOLN
INTRAMUSCULAR | Status: AC
  Filled 2023-06-12: qty 2

## 2023-06-12 MED ORDER — DEXAMETHASONE SODIUM PHOSPHATE 10 MG/ML IJ SOLN
INTRAMUSCULAR | Status: AC
  Filled 2023-06-12: qty 1

## 2023-06-12 NOTE — ED Triage Notes (Signed)
Pt states she started with scratchy throat on Friday. Then she started getting headache, facial pain, congestion, and ear pain on Sunday  has taken OTC cough, cold and flu as well as tension headache meds without relief.

## 2023-06-12 NOTE — ED Provider Notes (Addendum)
[facility]    CSN: [id number] Arrival date & time: 06/12/23  0803      History   Chief Complaint Chief Complaint  Patient presents with   Generalized Body Aches    Headache/ cough Kelli Tran hurt/ ears ache - Entered by patient   Cough   Otalgia   Headache    HPI Kelli Tran is a 52 y.o. female.   Patient presents to urgent care for evaluation of sore throat, body aches, generalized fatigue, headache, cough, and nasal congestion that started 2 days ago on Sunday June 10, 2023. Headache is to the bilateral forehead and described as throbbing pain, currently 8/10. History of migraines with aura, states this does not feel like typical migraine headache. Reports photophobia and phonophobia without dizziness or vision changes. Cough is mostly dry and nonproductive. Significant nasal congestion. Denies fever/chills, rash, N/V/D, abdominal pain, or recent known sick contacts with similar symptoms. Never smoker, denies drug use. No history of chronic respiratory problems. Using tylenol cold and flu and tylenol tension headache medicines without relief.    Cough Associated symptoms: ear pain and headaches   Otalgia Associated symptoms: cough and headaches   Headache Associated symptoms: cough and ear pain     Past Medical History:  Diagnosis Date   Heart murmur    childhood   Hypertension    Migraine    Migraine aura, persistent, intractable, with status migrainosus 01/13/2020   Migraine without aura, with intractable migraine, so stated, without mention of status migrainosus 04/09/2014   Other and unspecified ovarian cysts     Patient Active Problem List   Diagnosis Date Noted   Migraine aura, persistent, intractable, with status migrainosus 01/13/2020   Primary snoring 03/29/2017   Chest pain 12/29/2016   History of essential hypertension 12/29/2016   Elevated troponin 12/29/2016   S/P hysterectomy 06/01/2016   Migraine without aura, with intractable migraine,  so stated, without mention of status migrainosus 04/09/2014    Past Surgical History:  Procedure Laterality Date   BREAST SURGERY     DIAGNOSTIC LAPAROSCOPY     ectopic   DILATION AND CURETTAGE OF UTERUS     MYOMECTOMY     ROBOTIC ASSISTED TOTAL HYSTERECTOMY WITH SALPINGECTOMY Bilateral 06/01/2016   Procedure: ROBOTIC ASSISTED TOTAL HYSTERECTOMY WITH BILATERAL SALPINGECTOMY;  Surgeon: Maxie Better, MD;  Location: [facility];  Service: Gynecology;  Laterality: Bilateral;    OB History   No obstetric history on file.      Home Medications    Prior to Admission medications   Medication Sig Start Date End Date Taking? Authorizing Provider  amLODipine (NORVASC) 5 MG tablet Take 5 mg by mouth every evening. 12/12/21  Yes [provider]  Guaifenesin 1200 MG TB12 Take 1 tablet (1,200 mg total) by mouth in the morning and at bedtime. 06/12/23  Yes Carlisle Beers, FNP  Multiple Vitamins-Minerals (MULTIVITAMIN ADULT PO) Take 1 tablet by mouth daily.   Yes [provider]  pravastatin (PRAVACHOL) 10 MG tablet Take 10 mg by mouth at bedtime. 09/28/21  Yes [provider]  promethazine-dextromethorphan (PROMETHAZINE-DM) 6.25-15 MG/5ML syrup Take 5 mLs by mouth at bedtime as needed for cough. 06/12/23  Yes Carlisle Beers, FNP  pantoprazole (PROTONIX) 40 MG tablet Take by mouth. 06/13/21   [provider]  predniSONE (DELTASONE) 20 MG tablet Take 2 tablets (40 mg total) by mouth daily. Patient not taking: Reported on 12/13/2021 06/25/21   Mardella Layman, MD  tiZANidine (ZANAFLEX) 4 MG  capsule Take 4 mg by mouth 3 (three) times daily as needed for muscle spasms. 11/09/21   [provider]  triamcinolone cream (KENALOG) 0.1 % Apply 1 application topically 2 (two) times daily. Patient not taking: Reported on 12/13/2021 06/25/21   Mardella Layman, MD  trimethoprim-polymyxin b (POLYTRIM) ophthalmic solution Place 1 drop into the left eye every 4 (four)  hours. Patient not taking: Reported on 12/13/2021 06/17/21   Rushie Chestnut, PA-C    Family History Family History  Problem Relation Age of Onset   Hypertension Mother    Diabetes Mother    Migraines Mother    Luiz Blare' disease Mother    Hypertension Father    Cancer Father    Kidney cancer Father    Breast cancer Sister    Hypertension Sister    Migraines Sister     Social History Social History   Tobacco Use   Smoking status: Never   Smokeless tobacco: Never  Vaping Use   Vaping status: Never Used  Substance Use Topics   Alcohol use: Yes    Comment: occ   Drug use: No     Allergies   Contrast media [iodinated contrast media], Gadolinium derivatives, Levaquin [levofloxacin in d5w], Other, and Tessalon [benzonatate]   Review of Systems Review of Systems  HENT:  Positive for ear pain.   Respiratory:  Positive for cough.   Neurological:  Positive for headaches.  Per HPI   Physical Exam Triage Vital Signs ED Triage Vitals  Encounter Vitals Group     BP 06/12/23 0819 (!) 146/96     Systolic BP Percentile --      Diastolic BP Percentile --      Pulse Rate 06/12/23 0819 75     Resp 06/12/23 0819 18     Temp 06/12/23 0819 98.2 F (36.8 C)     Temp Source 06/12/23 0819 Oral     SpO2 06/12/23 0819 95 %     Weight --      Height --      Head Circumference --      Peak Flow --      Pain Score 06/12/23 0817 8     Pain Loc --      Pain Education --      Exclude from Growth Chart --    No data found.  Updated Vital Signs BP (!) 146/96 (BP Location: Left Arm)   Pulse 75   Temp 98.2 F (36.8 C) (Oral)   Resp 18   LMP 05/31/2016   SpO2 95%   Visual Acuity Right Eye Distance:   Left Eye Distance:   Bilateral Distance:    Right Eye Near:   Left Eye Near:    Bilateral Near:     Physical Exam Vitals and nursing note reviewed.  Constitutional:      Appearance: She is ill-appearing. She is not toxic-appearing.  HENT:     Head: Normocephalic and  atraumatic.     Right Ear: Hearing, tympanic membrane, ear canal and external ear normal.     Left Ear: Hearing, tympanic membrane, ear canal and external ear normal.     Nose: Nose normal.     Mouth/Throat:     Lips: Pink.     Mouth: Mucous membranes are moist. No injury.     Tongue: No lesions. Tongue does not deviate from midline.     Palate: No mass and lesions.     Pharynx: Oropharynx is clear. Uvula midline. No  pharyngeal swelling, oropharyngeal exudate, posterior oropharyngeal erythema or uvula swelling.     Tonsils: No tonsillar exudate or tonsillar abscesses.  Eyes:     General: Lids are normal. Vision grossly intact. Gaze aligned appropriately.        Right eye: No discharge.        Left eye: No discharge.     Extraocular Movements: Extraocular movements intact.     Conjunctiva/sclera: Conjunctivae normal.     Pupils: Pupils are equal, round, and reactive to light.     Comments: EOMs intact without pain or dizziness elicited.  Cardiovascular:     Rate and Rhythm: Normal rate and regular rhythm.     Heart sounds: Normal heart sounds, S1 normal and S2 normal.  Pulmonary:     Effort: Pulmonary effort is normal. No respiratory distress.     Breath sounds: Normal breath sounds and air entry. No stridor. No wheezing, rhonchi or rales.  Chest:     Chest wall: No tenderness.  Musculoskeletal:     Cervical back: Neck supple.     Right lower leg: No edema.     Left lower leg: No edema.  Skin:    General: Skin is warm and dry.     Capillary Refill: Capillary refill takes less than 2 seconds.     Findings: No rash.  Neurological:     General: No focal deficit present.     Mental Status: She is alert and oriented to person, place, and time. Mental status is at baseline.     Cranial Nerves: No dysarthria or facial asymmetry.  Psychiatric:        Mood and Affect: Mood normal.        Speech: Speech normal.        Behavior: Behavior normal.        Thought Content: Thought content  normal.        Judgment: Judgment normal.      UC Treatments / Results  Labs (all labs ordered are listed, but only abnormal results are displayed) Labs Reviewed  SARS CORONAVIRUS 2 (TAT 6-24 HRS)    EKG   Radiology No results found.  Procedures Procedures (including critical care time)  Medications Ordered in UC Medications  ketorolac (TORADOL) 30 MG/ML injection 30 mg (has no administration in time range)  metoCLOPramide (REGLAN) injection 5 mg (has no administration in time range)  dexamethasone (DECADRON) injection 10 mg (has no administration in time range)    Initial Impression / Assessment and Plan / UC Course  I have reviewed the triage vital signs and the nursing notes.  Pertinent labs & imaging results that were available during my care of the patient were reviewed by me and considered in my medical decision making (see chart for details).   1.  Viral URI with cough, bad headache, elevated blood pressure reading with diagnosis of hypertension Evaluation suggests viral URI etiology. Will manage this with recommendations for OTC and prescription medications for symptomatic relief. Encouraged to push fluids to stay well hydrated.   Imaging: deferred based on stable cardiopulmonary exam/hemodynamically stable vital signs  Viral testing: COVID-19 testing is pending, patient is a candidate for antiviral therapy and may have Paxlovid if test positive for COVID and within the 5-day window based on history of hypertension and HLD.  She must stop her pravastatin while taking Paxlovid.  If she is uncomfortable with this, she may have molnupiravir instead.  Medications given in clinic: Headache cocktail given in clinic for headache  likely secondary to viral illness.  Low suspicion for migraine etiology.  No NSAIDs for 24 hours due to ketorolac administration. Reviewed most recent blood work from 2023 showing normal renal function. Blood pressure elevated secondary to head  pain and viral illness. Advised to continue taking amlodipine medication as prescribed. No red flag signs or symptoms indicating need for referral to the emergency department for further workup regarding elevated blood pressure reading.  Counseled patient on potential for adverse effects with medications prescribed/recommended today, strict ER and return-to-clinic precautions discussed, patient verbalized understanding.    Final Clinical Impressions(s) / UC Diagnoses   Final diagnoses:  Viral URI with cough  Bad headache  Elevated blood pressure reading with diagnosis of hypertension     Discharge Instructions      Your symptoms are most likely due to a viral illness, which will improve on its own with rest and fluids.  - Take prescribed medicines to help with symptoms: promethazine DM - Use over the counter medicines to help with symptoms as discussed: Ibuprofen, tylenol, guaifenesin (mucinex), zyrtec, etc - Two teaspoons of honey in warm water every 4-6 hours may help with throat pains - Humidifier in your room at night to help add water the air and soothe cough - No ibuprofen until tomorrow since I gave you the headache medicines (shots) in the clinic.  If you develop any new or worsening symptoms or do not improve in the next 2 to 3 days, please return.  If your symptoms are severe, please go to the emergency room.  Follow-up with PCP as needed.     ED Prescriptions     Medication Sig Dispense Auth. Provider   Guaifenesin 1200 MG TB12 Take 1 tablet (1,200 mg total) by mouth in the morning and at bedtime. 14 tablet Carlisle Beers, FNP   promethazine-dextromethorphan (PROMETHAZINE-DM) 6.25-15 MG/5ML syrup Take 5 mLs by mouth at bedtime as needed for cough. 118 mL Carlisle Beers, FNP      PDMP not reviewed this encounter.   Carlisle Beers, FNP 06/12/23 0854    Carlisle Beers, FNP 06/12/23 [phone]

## 2023-06-12 NOTE — Discharge Instructions (Addendum)
Your symptoms are most likely due to a viral illness, which will improve on its own with rest and fluids.  - Take prescribed medicines to help with symptoms: promethazine DM - Use over the counter medicines to help with symptoms as discussed: Ibuprofen, tylenol, guaifenesin (mucinex), zyrtec, etc - Two teaspoons of honey in warm water every 4-6 hours may help with throat pains - Humidifier in your room at night to help add water the air and soothe cough - No ibuprofen until tomorrow since I gave you the headache medicines (shots) in the clinic.  If you develop any new or worsening symptoms or do not improve in the next 2 to 3 days, please return.  If your symptoms are severe, please go to the emergency room.  Follow-up with PCP as needed.

## 2023-06-13 ENCOUNTER — Telehealth (HOSPITAL_COMMUNITY): Payer: Self-pay | Admitting: Emergency Medicine

## 2023-06-13 MED ORDER — NIRMATRELVIR/RITONAVIR (PAXLOVID)TABLET: 3.0000 | ORAL_TABLET | Freq: Two times a day (BID) | ORAL | 0 refills | Status: AC

## 2023-11-04 ENCOUNTER — Emergency Department (HOSPITAL_COMMUNITY): Payer: Managed Care, Other (non HMO)

## 2023-11-04 ENCOUNTER — Ambulatory Visit (HOSPITAL_COMMUNITY)
Admission: EM | Admit: 2023-11-04 | Discharge: 2023-11-04 | Disposition: A | Discharge status: Home / Self Care | Payer: Managed Care, Other (non HMO) | Attending: Internal Medicine | Admitting: Internal Medicine

## 2023-11-04 ENCOUNTER — Ambulatory Visit (INDEPENDENT_AMBULATORY_CARE_PROVIDER_SITE_OTHER): Payer: Managed Care, Other (non HMO)

## 2023-11-04 ENCOUNTER — Encounter (HOSPITAL_COMMUNITY): Payer: Self-pay | Admitting: *Deleted

## 2023-11-04 ENCOUNTER — Encounter (HOSPITAL_COMMUNITY): Payer: Self-pay

## 2023-11-04 ENCOUNTER — Emergency Department (HOSPITAL_COMMUNITY)
Admission: EM | Admit: 2023-11-04 | Discharge: 2023-11-04 | Disposition: A | Discharge status: Home / Self Care | Payer: Managed Care, Other (non HMO) | Attending: Emergency Medicine | Admitting: Emergency Medicine

## 2023-11-04 ENCOUNTER — Other Ambulatory Visit: Payer: Self-pay

## 2023-11-04 DIAGNOSIS — R059 Cough, unspecified: Secondary | ICD-10-CM | POA: Diagnosis not present

## 2023-11-04 DIAGNOSIS — R0602 Shortness of breath: Secondary | ICD-10-CM | POA: Diagnosis not present

## 2023-11-04 DIAGNOSIS — M545 Low back pain, unspecified: Secondary | ICD-10-CM | POA: Insufficient documentation

## 2023-11-04 DIAGNOSIS — R0789 Other chest pain: Secondary | ICD-10-CM

## 2023-11-04 DIAGNOSIS — M5440 Lumbago with sciatica, unspecified side: Secondary | ICD-10-CM

## 2023-11-04 DIAGNOSIS — Z79899 Other long term (current) drug therapy: Secondary | ICD-10-CM | POA: Diagnosis not present

## 2023-11-04 DIAGNOSIS — I10 Essential (primary) hypertension: Secondary | ICD-10-CM | POA: Diagnosis not present

## 2023-11-04 DIAGNOSIS — R079 Chest pain, unspecified: Secondary | ICD-10-CM | POA: Diagnosis present

## 2023-11-04 LAB — BASIC METABOLIC PANEL
Anion gap: 11 (ref 5–15)
BUN: 6 mg/dL (ref 6–20)
CO2: 20 mmol/L — ABNORMAL LOW (ref 22–32)
Calcium: 9.1 mg/dL (ref 8.9–10.3)
Chloride: 105 mmol/L (ref 98–111)
Creatinine, Ser: 0.62 mg/dL (ref 0.44–1.00)
GFR, Estimated: >60 mL/min (ref >60)
Glucose, Bld: 89 mg/dL (ref 70–99)
Potassium: 3.4 mmol/L — ABNORMAL LOW (ref 3.5–5.1)
Sodium: 136 mmol/L (ref 135–145)

## 2023-11-04 LAB — CBC
HCT: 41.4 % (ref 36.0–46.0)
Hemoglobin: 14.8 g/dL (ref 12.0–15.0)
MCH: 28.3 pg (ref 26.0–34.0)
MCHC: 35.7 g/dL (ref 30.0–36.0)
MCV: 79.2 fL — ABNORMAL LOW (ref 80.0–100.0)
Platelets: 309 10*3/uL (ref 150–400)
RBC: 5.23 MIL/uL — ABNORMAL HIGH (ref 3.87–5.11)
RDW: 13 % (ref 11.5–15.5)
WBC: 8.6 10*3/uL (ref 4.0–10.5)
nRBC: 0 % (ref 0.0–0.2)

## 2023-11-04 LAB — POCT URINALYSIS DIP (MANUAL ENTRY)
Bilirubin, UA: NEGATIVE
Blood, UA: NEGATIVE
Glucose, UA: NEGATIVE mg/dL
Ketones, POC UA: NEGATIVE mg/dL
Leukocytes, UA: NEGATIVE
Nitrite, UA: NEGATIVE
Protein Ur, POC: NEGATIVE mg/dL
Spec Grav, UA: 1.015 (ref 1.010–1.025)
Urobilinogen, UA: 0.2 U/dL
pH, UA: 6.5 (ref 5.0–8.0)

## 2023-11-04 LAB — TROPONIN I (HIGH SENSITIVITY)
Troponin I (High Sensitivity): 3 ng/L (ref <18)
Troponin I (High Sensitivity): <2 ng/L (ref <18)

## 2023-11-04 LAB — D-DIMER, QUANTITATIVE: D-Dimer, Quant: 0.54 ug{FEU}/mL — ABNORMAL HIGH (ref 0.00–0.50)

## 2023-11-04 MED ORDER — MORPHINE SULFATE (PF) 4 MG/ML IV SOLN
4.0000 mg | Freq: Once | INTRAVENOUS | Status: AC
  Administered 2023-11-04: 4 mg via INTRAVENOUS
  Filled 2023-11-04: qty 1

## 2023-11-04 MED ORDER — GABAPENTIN 300 MG PO CAPS: 300.0000 mg | ORAL_CAPSULE | Freq: Three times a day (TID) | ORAL | 0 refills | Status: DC

## 2023-11-04 MED ORDER — DIPHENHYDRAMINE HCL 25 MG PO CAPS
50.0000 mg | ORAL_CAPSULE | Freq: Once | ORAL | Status: AC
  Filled 2023-11-04: qty 2

## 2023-11-04 MED ORDER — METHYLPREDNISOLONE SODIUM SUCC 40 MG IJ SOLR
40.0000 mg | Freq: Once | INTRAMUSCULAR | Status: AC
  Administered 2023-11-04: 40 mg via INTRAVENOUS
  Filled 2023-11-04: qty 1

## 2023-11-04 MED ORDER — KETOROLAC TROMETHAMINE 30 MG/ML IJ SOLN
30.0000 mg | Freq: Once | INTRAMUSCULAR | Status: AC
  Administered 2023-11-04: 30 mg via INTRAMUSCULAR

## 2023-11-04 MED ORDER — IOHEXOL 350 MG/ML SOLN
75.0000 mL | Freq: Once | INTRAVENOUS | Status: AC | PRN
  Administered 2023-11-04: 75 mL via INTRAVENOUS

## 2023-11-04 MED ORDER — DIPHENHYDRAMINE HCL 50 MG/ML IJ SOLN
50.0000 mg | Freq: Once | INTRAMUSCULAR | Status: AC
  Administered 2023-11-04: 50 mg via INTRAVENOUS
  Filled 2023-11-04: qty 1

## 2023-11-04 MED ORDER — KETOROLAC TROMETHAMINE 30 MG/ML IJ SOLN
INTRAMUSCULAR | Status: AC
  Filled 2023-11-04: qty 1
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
## 2023-11-04 NOTE — ED Provider Triage Note (Signed)
Emergency Medicine Provider Triage Evaluation Note  Kelli Tran , a 52 y.o. female  was evaluated in triage.  Pt complains of chest pain.  Reports chest pain shortness of breath along with some lower back pain ongoing for the past several days.  Recent long travel earlier in the week.  No fever or chills no productive cough.  No history of cardiac disease no prior history of PE.  Was seen in urgent care center earlier today but sent here for PE rule out  Review of Systems  Positive: As above Negative: As above  Physical Exam  BP (!) 155/100   Pulse 85   Temp 97.9 F (36.6 C) (Oral)   Resp 16   LMP 05/31/2016   SpO2 100%  Gen:   Awake, no distress   Resp:  Normal effort  MSK:   Moves extremities without difficulty  Other:    Medical Decision Making  Medically screening exam initiated at 1:48 PM.  Appropriate orders placed.  Kelli Tran was informed that the remainder of the evaluation will be completed by another provider, this initial triage assessment does not replace that evaluation, and the importance of remaining in the ED until their evaluation is complete.     Kelli Helper, PA-C 11/04/23 1352
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
## 2023-11-04 NOTE — Discharge Instructions (Signed)
You have been seen here in the emergency department for low back pain. We have obtained a full history, performed a physical exam, in addition to other diagnostic tests and treatments. Right now, we feel that you are safe for discharge from a medical perspective, and do not have an acute life threatening illness.   To do: 1.) Take all medications as prescribed.   2.) If anything changes, or you develop fevers, chills, inability to eat or drink, severe pain, new symptoms, return of symptoms, worsening of symptoms, or any other concerns, please call 911 or come back to the emergency department as soon as possible.   3.) Please make an appointment with your primary care doctor for a follow-up visit after being seen here in the emergency department. If you do not have a primary care doctor, you can call [phone] for assistance in finding one or your health care insurance company.    Thank you for allowing me to take care of you today. We hope that you feel better soon.
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
## 2023-11-04 NOTE — Discharge Instructions (Signed)
Please go to the emergency department for further evaluation.  Your chest x-ray is normal.  The persistence and severity of her chest pain requires further evaluation.

## 2023-11-04 NOTE — ED Notes (Signed)
Awaiting patient from lobby 

## 2023-11-04 NOTE — ED Notes (Signed)
CT called about patient being pre-medicated for contrast allergy.  Tory in CT notified and aware.  Pt to be taken back to CT at 1845.

## 2023-11-04 NOTE — ED Triage Notes (Signed)
Pt reports having lower back pain , nausea and chest pressure . Pt reports she has reflux .

## 2023-11-04 NOTE — ED Triage Notes (Signed)
Patient sent from [facility] for further evaluation of chest pain and bacj pain. Patient recently drove several hours and sent to r/o PE. Does endorse sob when supine. Patient also endorses cough at night

## 2024-09-28 ENCOUNTER — Encounter (HOSPITAL_COMMUNITY): Payer: Self-pay

## 2024-09-28 ENCOUNTER — Ambulatory Visit (HOSPITAL_COMMUNITY)
Admission: EM | Admit: 2024-09-28 | Discharge: 2024-09-28 | Disposition: A | Discharge status: Home / Self Care | Attending: Physician Assistant | Admitting: Physician Assistant

## 2024-09-28 DIAGNOSIS — J029 Acute pharyngitis, unspecified: Secondary | ICD-10-CM

## 2024-09-28 LAB — POCT RAPID STREP A (OFFICE): Rapid Strep A Screen: NEGATIVE

## 2024-09-28 LAB — POCT INFLUENZA A/B
Influenza A, POC: NEGATIVE
Influenza B, POC: NEGATIVE

## 2024-09-28 LAB — POC SOFIA SARS ANTIGEN FIA: SARS Coronavirus 2 Ag: NEGATIVE

## 2024-09-28 MED ORDER — LIDOCAINE VISCOUS HCL 2 % MT SOLN
15.0000 mL | Freq: Once | OROMUCOSAL | Status: AC
  Administered 2024-09-28: 15 mL via OROMUCOSAL

## 2024-09-28 MED ORDER — AMOXICILLIN-POT CLAVULANATE 875-125 MG PO TABS: 1.0000 | ORAL_TABLET | Freq: Two times a day (BID) | ORAL | 0 refills | Status: AC

## 2024-09-28 MED ORDER — LIDOCAINE VISCOUS HCL 2 % MT SOLN: 15.0000 mL | OROMUCOSAL | 0 refills | Status: AC | PRN

## 2024-09-28 MED ORDER — LIDOCAINE VISCOUS HCL 2 % MT SOLN
OROMUCOSAL | Status: AC
  Filled 2024-09-28: qty 15

## 2024-09-28 MED ORDER — KETOROLAC TROMETHAMINE 30 MG/ML IJ SOLN
30.0000 mg | Freq: Once | INTRAMUSCULAR | Status: AC
  Administered 2024-09-28: 30 mg via INTRAMUSCULAR

## 2024-09-28 MED ORDER — KETOROLAC TROMETHAMINE 30 MG/ML IJ SOLN
INTRAMUSCULAR | Status: AC
  Filled 2024-09-28: qty 1

## 2024-09-28 NOTE — ED Provider Notes (Signed)
 [facility]    CSN: [id number] Arrival date & time: 09/28/24  1333      History   Chief Complaint Chief Complaint  Patient presents with   Sore Throat    HPI Kelli Tran is a 53 y.o. female.   Patient presents today with a several day history of significant right sided sore throat pain that radiates into her ear.  She reports that it is painful all the time and occasionally will get a sharp stabbing pain randomly but then is also worse with swallowing.  Pain is rated 10 on a 0-10 pain scale, described as sharp, no alleviating factors identified.  She does report some congestion, cough, headache but denies any fever, swelling of her throat, shortness of breath, muffled voice, nausea, vomiting.  She does report that her son had some congestion but does not know what he was ultimately diagnosed with.  She has tried Tylenol  with minimal improvement of symptoms.  She is eating and drinking despite the symptoms.  She denies any recent antibiotics or steroids.    Past Medical History:  Diagnosis Date   Heart murmur    childhood   Hypertension    Migraine    Migraine aura, persistent, intractable, with status migrainosus 01/13/2020   Migraine without aura, with intractable migraine, so stated, without mention of status migrainosus 04/09/2014   Other and unspecified ovarian cysts     Patient Active Problem List   Diagnosis Date Noted   Migraine aura, persistent, intractable, with status migrainosus 01/13/2020   Primary snoring 03/29/2017   Chest pain 12/29/2016   History of essential hypertension 12/29/2016   Elevated troponin 12/29/2016   S/P hysterectomy 06/01/2016   Intractable migraine without aura 04/09/2014    Past Surgical History:  Procedure Laterality Date   BREAST SURGERY     DIAGNOSTIC LAPAROSCOPY     ectopic   DILATION AND CURETTAGE OF UTERUS     MYOMECTOMY     ROBOTIC ASSISTED TOTAL HYSTERECTOMY WITH SALPINGECTOMY Bilateral 06/01/2016   Procedure:  ROBOTIC ASSISTED TOTAL HYSTERECTOMY WITH BILATERAL SALPINGECTOMY;  Surgeon: Dickie Carder, MD;  Location: [facility];  Service: Gynecology;  Laterality: Bilateral;    OB History   No obstetric history on file.      Home Medications    Prior to Admission medications   Medication Sig Start Date End Date Taking? Authorizing Provider  amLODipine  (NORVASC ) 5 MG tablet Take 5 mg by mouth every evening. 12/12/21  Yes [provider]  amoxicillin-clavulanate (AUGMENTIN) 875-125 MG tablet Take 1 tablet by mouth every 12 (twelve) hours. 09/28/24  Yes Danyella Mcginty K, PA-C  lidocaine  (XYLOCAINE ) 2 % solution Use as directed 15 mLs in the mouth or throat every 4 (four) hours as needed for mouth pain. 09/28/24  Yes Jacqueli Pangallo K, PA-C  pravastatin (PRAVACHOL) 10 MG tablet Take 10 mg by mouth at bedtime. 09/28/21  Yes [provider]  gabapentin  (NEURONTIN ) 300 MG capsule Take 1 capsule (300 mg total) by mouth 3 (three) times daily. 11/04/23   Gaetano Pac, MD  Guaifenesin  1200 MG TB12 Take 1 tablet (1,200 mg total) by mouth in the morning and at bedtime. 06/12/23   Enedelia Dorna CHRISTELLA, FNP  Multiple Vitamins-Minerals (MULTIVITAMIN ADULT PO) Take 1 tablet by mouth daily.    [provider]  pantoprazole  (PROTONIX ) 40 MG tablet Take by mouth. 06/13/21   [provider]  tiZANidine (ZANAFLEX) 4 MG capsule Take 4 mg by mouth 3 (three) times daily as  needed for muscle spasms. 11/09/21   [provider]  triamcinolone  cream (KENALOG ) 0.1 % Apply 1 application topically 2 (two) times daily. Patient not taking: Reported on 12/13/2021 06/25/21   Rolinda Rogue, MD    Family History Family History  Problem Relation Age of Onset   Hypertension Mother    Diabetes Mother    Migraines Mother    Yvone' disease Mother    Hypertension Father    Cancer Father    Kidney cancer Father    Breast cancer Sister    Hypertension Sister    Migraines Sister     Social  History Social History   Tobacco Use   Smoking status: Never   Smokeless tobacco: Never  Vaping Use   Vaping status: Never Used  Substance Use Topics   Alcohol use: Yes    Comment: occ   Drug use: No     Allergies   Contrast media [iodinated contrast media], Gadolinium derivatives, Levaquin [levofloxacin in d5w], Other, and Tessalon [benzonatate]   Review of Systems Review of Systems  Constitutional:  Positive for activity change. Negative for appetite change, fatigue and fever.  HENT:  Positive for congestion, ear pain, sore throat and trouble swallowing. Negative for sinus pressure, sneezing and voice change.   Respiratory:  Positive for cough. Negative for shortness of breath.   Cardiovascular:  Negative for chest pain.  Gastrointestinal:  Negative for abdominal pain, diarrhea, nausea and vomiting.  Neurological:  Positive for headaches. Negative for dizziness and light-headedness.     Physical Exam Triage Vital Signs ED Triage Vitals  Encounter Vitals Group     BP 09/28/24 1531 (!) 154/93     Girls Systolic BP Percentile --      Girls Diastolic BP Percentile --      Boys Systolic BP Percentile --      Boys Diastolic BP Percentile --      Pulse Rate 09/28/24 1531 74     Resp 09/28/24 1531 18     Temp 09/28/24 1531 98.2 F (36.8 C)     Temp Source 09/28/24 1531 Oral     SpO2 09/28/24 1531 97 %     Weight --      Height --      Head Circumference --      Peak Flow --      Pain Score 09/28/24 1530 10     Pain Loc --      Pain Education --      Exclude from Growth Chart --    No data found.  Updated Vital Signs BP (!) 154/93 (BP Location: Right Arm)   Pulse 74   Temp 98.2 F (36.8 C) (Oral)   Resp 18   LMP 05/31/2016   SpO2 97%   Visual Acuity Right Eye Distance:   Left Eye Distance:   Bilateral Distance:    Right Eye Near:   Left Eye Near:    Bilateral Near:     Physical Exam Vitals reviewed.  Constitutional:      General: She is awake. She  is not in acute distress.    Appearance: Normal appearance. She is well-developed. She is not ill-appearing.     Comments: Very pleasant female appears stated age in no acute distress sitting comfortably in exam room  HENT:     Head: Normocephalic and atraumatic.     Right Ear: Tympanic membrane, ear canal and external ear normal. Tympanic membrane is not erythematous or bulging.  Left Ear: Tympanic membrane, ear canal and external ear normal. Tympanic membrane is not erythematous or bulging.     Nose: Congestion present.     Right Sinus: No maxillary sinus tenderness or frontal sinus tenderness.     Left Sinus: No maxillary sinus tenderness or frontal sinus tenderness.     Mouth/Throat:     Pharynx: Uvula midline. Posterior oropharyngeal erythema present. No oropharyngeal exudate.     Tonsils: No tonsillar exudate or tonsillar abscesses. 1+ on the right. 1+ on the left.  Cardiovascular:     Rate and Rhythm: Normal rate and regular rhythm.     Heart sounds: Normal heart sounds, S1 normal and S2 normal. No murmur heard. Pulmonary:     Effort: Pulmonary effort is normal.     Breath sounds: Normal breath sounds. No wheezing, rhonchi or rales.     Comments: Clear to auscultation bilaterally Lymphadenopathy:     Head:     Right side of head: No submental, submandibular or tonsillar adenopathy.     Left side of head: No submental, submandibular or tonsillar adenopathy.     Cervical: No cervical adenopathy.  Psychiatric:        Behavior: Behavior is cooperative.      UC Treatments / Results  Labs (all labs ordered are listed, but only abnormal results are displayed) Labs Reviewed  POCT RAPID STREP A (OFFICE) - Normal  POC SOFIA SARS ANTIGEN FIA - Normal  POCT INFLUENZA A/B - Normal    EKG   Radiology No results found.  Procedures Procedures (including critical care time)  Medications Ordered in UC Medications  ketorolac  (TORADOL ) 30 MG/ML injection 30 mg (30 mg  Intramuscular Given 09/28/24 1704)  lidocaine  (XYLOCAINE ) 2 % viscous mouth solution 15 mL (15 mLs Mouth/Throat Given 09/28/24 1730)    Initial Impression / Assessment and Plan / UC Course  I have reviewed the triage vital signs and the nursing notes.  Pertinent labs & imaging results that were available during my care of the patient were reviewed by me and considered in my medical decision making (see chart for details).     Patient was obviously in significant pain and tearful at times during her exam.  She is otherwise well-appearing, nontoxic, afebrile.  Strep testing was negative.  She was negative for COVID and flu.  She reported severe pain in her right posterior oropharynx but there was no evidence of a peritonsillar abscess and she is managing her secretions so ER evaluation was deferred.  She was given Toradol  injection in clinic which provided minimal relief of symptoms.  We then provided her viscous lidocaine  in clinic which did provide some improvement but not resolution of symptoms.  I am unsure what is causing her symptoms but will cover for infection with Augmentin twice daily for 10 days.  No indication for dose adjustment based on metabolic panel from 12/26/2023 with creatinine of 0.62 calculated creatinine clearance of 111 mL/min.  We discussed that given Toradol  injection today she should avoid NSAIDs for the next 24 hours but can use Tylenol  for breakthrough pain.  She was sent home with viscous lidocaine  to be used every 4 hours as needed but we discussed that she should not eat or drink immediately after using this medication due to risk of choking.  She can gargle with warm salt water for additional symptom relief.  We discussed that if her symptoms are improving quickly with antibiotics and conservative treatment measures she should go to the emergency  room since we do not have advanced imaging capabilities in urgent care.  We discussed that if anything worsens that she should go  immediately to the emergency room including worsening pain, dysphagia, fever, nausea/vomiting interfering with oral intake, muffled voice, swelling of her throat, shortness of breath.  Strict turn precautions given.  Excuse note provided.  All questions answered patient satisfaction she expressed understanding and agreement with treatment plan.  Final Clinical Impressions(s) / UC Diagnoses   Final diagnoses:  Sore throat  Throat infection     Discharge Instructions      You tested negative for COVID, flu, strep.  I am concerned that you might have an infection in the back of your throat.  Start Augmentin twice daily for 10 days.  This can upset your stomach so take it with food.  We gave an injection of Toradol  today so do not take any NSAIDs (aspirin , ibuprofen /Advil , naproxen /Aleve ) for the next 24 hours.  You can use Tylenol  for breakthrough pain.  You can use viscous lidocaine  for additional symptom relief.  Gargle with this and spit it out every 4 hours as needed.  Do not eat or drink immediately after using this medication as it can cause choking.  As we discussed, if you continue to have very severe pain I recommend going to the emergency room where they can potentially do a CT scan and stat blood work that we do not have access to an urgent care.  If anything worsens and you get high fever, increasing pain, difficulty swallowing, swelling of your throat, shortness of breath you need to go to the ER immediately.     ED Prescriptions     Medication Sig Dispense Auth. Provider   amoxicillin-clavulanate (AUGMENTIN) 875-125 MG tablet Take 1 tablet by mouth every 12 (twelve) hours. 20 tablet Law Corsino K, PA-C   lidocaine  (XYLOCAINE ) 2 % solution Use as directed 15 mLs in the mouth or throat every 4 (four) hours as needed for mouth pain. 100 mL Theodosia Bahena K, PA-C      PDMP not reviewed this encounter.   Sherrell Rocky POUR, PA-C 09/28/24 1752

## 2024-09-28 NOTE — ED Triage Notes (Signed)
 Onset Thursday night with sore throat going into the head and neck. Now having constant sharp shooting pian in the right side neck and ear. Also having slight congestion. Son having congestion and cough as well.   Patient tried tylenol  with slight relief.

## 2024-09-28 NOTE — Discharge Instructions (Signed)
 You tested negative for COVID, flu, strep.  I am concerned that you might have an infection in the back of your throat.  Start Augmentin twice daily for 10 days.  This can upset your stomach so take it with food.  We gave an injection of Toradol  today so do not take any NSAIDs (aspirin , ibuprofen /Advil , naproxen /Aleve ) for the next 24 hours.  You can use Tylenol  for breakthrough pain.  You can use viscous lidocaine  for additional symptom relief.  Gargle with this and spit it out every 4 hours as needed.  Do not eat or drink immediately after using this medication as it can cause choking.  As we discussed, if you continue to have very severe pain I recommend going to the emergency room where they can potentially do a CT scan and stat blood work that we do not have access to an urgent care.  If anything worsens and you get high fever, increasing pain, difficulty swallowing, swelling of your throat, shortness of breath you need to go to the ER immediately.

## 2024-09-29 ENCOUNTER — Emergency Department (HOSPITAL_COMMUNITY)

## 2024-09-29 ENCOUNTER — Emergency Department (HOSPITAL_COMMUNITY)
Admission: EM | Admit: 2024-09-29 | Discharge: 2024-09-29 | Disposition: A | Discharge status: Home / Self Care | Attending: Emergency Medicine | Admitting: Emergency Medicine

## 2024-09-29 ENCOUNTER — Other Ambulatory Visit: Payer: Self-pay

## 2024-09-29 ENCOUNTER — Encounter (HOSPITAL_COMMUNITY): Payer: Self-pay

## 2024-09-29 DIAGNOSIS — G500 Trigeminal neuralgia: Secondary | ICD-10-CM | POA: Diagnosis not present

## 2024-09-29 DIAGNOSIS — J029 Acute pharyngitis, unspecified: Secondary | ICD-10-CM | POA: Diagnosis present

## 2024-09-29 LAB — CBC WITH DIFFERENTIAL/PLATELET
Abs Immature Granulocytes: 0.02 K/uL (ref 0.00–0.07)
Basophils Absolute: 0 K/uL (ref 0.0–0.1)
Basophils Relative: 1 %
Eosinophils Absolute: 0.1 K/uL (ref 0.0–0.5)
Eosinophils Relative: 1 %
HCT: 42.8 % (ref 36.0–46.0)
Hemoglobin: 15.2 g/dL — ABNORMAL HIGH (ref 12.0–15.0)
Immature Granulocytes: 0 %
Lymphocytes Relative: 35 %
Lymphs Abs: 2.8 K/uL (ref 0.7–4.0)
MCH: 28.5 pg (ref 26.0–34.0)
MCHC: 35.5 g/dL (ref 30.0–36.0)
MCV: 80.3 fL (ref 80.0–100.0)
Monocytes Absolute: 0.8 K/uL (ref 0.1–1.0)
Monocytes Relative: 11 %
Neutro Abs: 4.2 K/uL (ref 1.7–7.7)
Neutrophils Relative %: 52 %
Platelets: 347 K/uL (ref 150–400)
RBC: 5.33 MIL/uL — ABNORMAL HIGH (ref 3.87–5.11)
RDW: 13.2 % (ref 11.5–15.5)
WBC: 7.9 K/uL (ref 4.0–10.5)
nRBC: 0 % (ref 0.0–0.2)

## 2024-09-29 LAB — BASIC METABOLIC PANEL WITH GFR
Anion gap: 12 (ref 5–15)
BUN: 11 mg/dL (ref 6–20)
CO2: 23 mmol/L (ref 22–32)
Calcium: 9.3 mg/dL (ref 8.9–10.3)
Chloride: 103 mmol/L (ref 98–111)
Creatinine, Ser: 0.71 mg/dL (ref 0.44–1.00)
GFR, Estimated: >60 mL/min (ref >60)
Glucose, Bld: 105 mg/dL — ABNORMAL HIGH (ref 70–99)
Potassium: 3.9 mmol/L (ref 3.5–5.1)
Sodium: 138 mmol/L (ref 135–145)

## 2024-09-29 MED ORDER — VALACYCLOVIR HCL 500 MG PO TABS
500.0000 mg | ORAL_TABLET | Freq: Once | ORAL | Status: DC
  Filled 2024-09-29: qty 1

## 2024-09-29 MED ORDER — GABAPENTIN 300 MG PO CAPS
300.0000 mg | ORAL_CAPSULE | Freq: Once | ORAL | Status: AC
  Administered 2024-09-29: 300 mg via ORAL
  Filled 2024-09-29: qty 1

## 2024-09-29 MED ORDER — VALACYCLOVIR HCL 1 G PO TABS: 1000.0000 mg | ORAL_TABLET | Freq: Three times a day (TID) | ORAL | 0 refills | Status: AC

## 2024-09-29 MED ORDER — LIDOCAINE 5 % EX PTCH
1.0000 | MEDICATED_PATCH | CUTANEOUS | Status: DC
  Administered 2024-09-29: 1 via TRANSDERMAL
  Filled 2024-09-29: qty 1

## 2024-09-29 MED ORDER — GABAPENTIN 300 MG PO CAPS: 300.0000 mg | ORAL_CAPSULE | Freq: Three times a day (TID) | ORAL | 0 refills | Status: AC

## 2024-09-29 MED ORDER — HYDROCODONE-ACETAMINOPHEN 5-325 MG PO TABS: 1.0000 | ORAL_TABLET | Freq: Three times a day (TID) | ORAL | 0 refills | Status: AC | PRN

## 2024-09-29 MED ORDER — FENTANYL CITRATE (PF) 50 MCG/ML IJ SOSY
25.0000 ug | PREFILLED_SYRINGE | Freq: Once | INTRAMUSCULAR | Status: AC
  Administered 2024-09-29: 25 ug via INTRAVENOUS
  Filled 2024-09-29: qty 1

## 2024-09-29 MED ORDER — DEXAMETHASONE SOD PHOSPHATE PF 10 MG/ML IJ SOLN
8.0000 mg | Freq: Once | INTRAMUSCULAR | Status: AC
  Administered 2024-09-29: 8 mg via INTRAMUSCULAR

## 2024-09-29 MED ORDER — OXYCODONE-ACETAMINOPHEN 5-325 MG PO TABS
2.0000 | ORAL_TABLET | Freq: Once | ORAL | Status: AC
  Administered 2024-09-29: 2 via ORAL
  Filled 2024-09-29: qty 2

## 2024-09-29 MED ORDER — VALACYCLOVIR HCL 500 MG PO TABS
1000.0000 mg | ORAL_TABLET | Freq: Once | ORAL | Status: AC
  Administered 2024-09-29: 1000 mg via ORAL
  Filled 2024-09-29: qty 2

## 2024-09-29 NOTE — ED Provider Notes (Addendum)
 [facility] Provider Note   CSN: [id number] Arrival date & time: 09/29/24  9649   Seen yesterday at urgent care for right sided facial pain, given a 10-day course of Augmentin and a Toradol  injection and viscous lidocaine  to use every 4 hours as needed.  Patient was instructed to go to the emergency department if her pain did not get any better. She presented early this morning because her pain has not changed.  The pain started Thursday night.  She describes sharp electrical-like pains that occur in her right neck and radiate and shoot to her right ear.  She cannot predict when they will come and feels like they can, any minute.  Nothing is alleviated the pain and nothing seems to make it worse, she feels more comfortable wearing a neck hug which is really just providing her comfort she says.   She denies sore throat denies new acute cough or sputum production.  She has used a nasal decongestion for the last 2 nights but is not having significant congestion or rhinorrhea.  This pain has kept her up for the last couple of nights.  Denies tinnitus at this time but yesterday felt like she had ringing in her ears.  Denies changes in taste or any neurodeficit.  Denies ever having anything like this before, she did have ear pain when she had strep throat 1 time but says this pain is different.  Has a history of migraines and sometimes has visual aura but this does not feel like a migraine or aura that she has experienced before.  Patient presents with: Sore Throat   SENAIDA CHILCOTE is a 53 y.o. female.   The history is provided by the patient.  Sore Throat Associated symptoms include headaches. Pertinent negatives include no chest pain.       Prior to Admission medications   Medication Sig Start Date End Date Taking? Authorizing Provider  gabapentin  (NEURONTIN ) 300 MG capsule Take 1 capsule (300 mg total) by mouth 3 (three) times daily for 5 days.  09/29/24 10/04/24 Yes Kashena Novitski, DO  HYDROcodone-acetaminophen  (NORCO/VICODIN) 5-325 MG tablet Take 1 tablet by mouth every 8 (eight) hours as needed for up to 3 days. 09/29/24 10/02/24 Yes Markian Glockner, Viktoria, DO  valACYclovir (VALTREX) 1000 MG tablet Take 1 tablet (1,000 mg total) by mouth 3 (three) times daily for 7 days. 09/29/24 10/06/24 Yes Chibueze Beasley, DO  amLODipine  (NORVASC ) 5 MG tablet Take 5 mg by mouth every evening. 12/12/21   [provider]  amoxicillin-clavulanate (AUGMENTIN) 875-125 MG tablet Take 1 tablet by mouth every 12 (twelve) hours. 09/28/24   Raspet, Rocky POUR, PA-C  Guaifenesin  1200 MG TB12 Take 1 tablet (1,200 mg total) by mouth in the morning and at bedtime. 06/12/23   Enedelia Dorna CHRISTELLA, FNP  lidocaine  (XYLOCAINE ) 2 % solution Use as directed 15 mLs in the mouth or throat every 4 (four) hours as needed for mouth pain. 09/28/24   Raspet, Erin K, PA-C  Multiple Vitamins-Minerals (MULTIVITAMIN ADULT PO) Take 1 tablet by mouth daily.    [provider]  pantoprazole  (PROTONIX ) 40 MG tablet Take by mouth. 06/13/21   [provider]  pravastatin (PRAVACHOL) 10 MG tablet Take 10 mg by mouth at bedtime. 09/28/21   [provider]  tiZANidine (ZANAFLEX) 4 MG capsule Take 4 mg by mouth 3 (three) times daily as needed for muscle spasms. 11/09/21   [provider]  triamcinolone  cream (KENALOG ) 0.1 %  Apply 1 application topically 2 (two) times daily. Patient not taking: Reported on 12/13/2021 06/25/21   Rolinda Rogue, MD    Allergies: Contrast media [iodinated contrast media], Gadolinium derivatives, Levaquin [levofloxacin in d5w], Other, and Tessalon [benzonatate]    Review of Systems  Constitutional:  Negative for activity change, appetite change, chills, fatigue and fever.  HENT:  Positive for congestion, ear pain and tinnitus. Negative for hearing loss, rhinorrhea, sinus pressure, sinus pain, sore throat and trouble swallowing.    Cardiovascular:  Negative for chest pain.  Gastrointestinal:  Negative for constipation, diarrhea, nausea and vomiting.  Musculoskeletal:  Negative for neck pain and neck stiffness.  Neurological:  Positive for headaches. Negative for dizziness, facial asymmetry, speech difficulty and weakness.    Updated Vital Signs BP 137/88   Pulse 68   Temp 98 F (36.7 C) (Oral)   Resp 18   Ht 5' 1 (1.549 m)   Wt 65.8 kg   LMP 05/31/2016   SpO2 100%   BMI 27.40 kg/m   Physical Exam Constitutional:      Appearance: She is not ill-appearing, toxic-appearing or diaphoretic.     Comments: Uncomfortable appearing female lying in bed  HENT:     Right Ear: Tympanic membrane and ear canal normal. No tenderness.     Left Ear: Tympanic membrane and ear canal normal. No tenderness.     Nose: No congestion or rhinorrhea.     Mouth/Throat:     Mouth: Mucous membranes are moist. No oral lesions.     Pharynx: Oropharynx is clear. Uvula midline. No pharyngeal swelling, oropharyngeal exudate or uvula swelling.  Eyes:     Conjunctiva/sclera: Conjunctivae normal.  Pulmonary:     Effort: Pulmonary effort is normal.  Neurological:     General: No focal deficit present.     Mental Status: She is oriented to person, place, and time.  Psychiatric:        Mood and Affect: Mood normal.        Behavior: Behavior normal.     (all labs ordered are listed, but only abnormal results are displayed) Labs Reviewed  CBC WITH DIFFERENTIAL/PLATELET - Abnormal; Notable for the following components:      Result Value   RBC 5.33 (*)    Hemoglobin 15.2 (*)    All other components within normal limits  BASIC METABOLIC PANEL WITH GFR - Abnormal; Notable for the following components:   Glucose, Bld 105 (*)    All other components within normal limits    EKG: None  Radiology: CT Soft Tissue Neck Wo Contrast Result Date: 09/29/2024 EXAM: CT NECK WITHOUT CONTRAST 09/29/2024 09:43:48 AM TECHNIQUE: CT of the neck  was performed without the administration of intravenous contrast. Multiplanar reformatted images are provided for review. Automated exposure control, iterative reconstruction, and/or weight based adjustment of the mA/kV was utilized to reduce the radiation dose to as low as reasonably achievable. COMPARISON: CT neck 10/17/2015. CLINICAL HISTORY: R sided anterior cervical and mandibular swelling and pain x 3 days with tinnitus. FINDINGS: AERODIGESTIVE TRACT: Nasopharynx is symmetric. Similar appearance of the palatine tonsils. There is no evidence of peritonsillar collection. Torus palatinus. The oral cavity is otherwise unremarkable. The floor of mouth is unremarkable. There is no evidence of soft tissue swelling along the right aspect of the mandible. There is asymmetry of the piriform sinuses which is unchanged from 2016. No masslike soft tissue in this region. The supraglottic airway is unremarkable. The vocal folds are symmetric. No discrete mass.  No edema. SALIVARY GLANDS: The parotid and submandibular glands are unremarkable. THYROID: Unremarkable. LYMPH NODES: There are no pathologically enlarged or suspicious appearing lymph nodes in the neck. Bilateral subcentimeter cervical lymph nodes are unchanged from prior. SOFT TISSUES: The overlying right facial subcutaneous tissues are unremarkable. There is no subcutaneous stranding or mass or fluid collection. BRAIN, ORBITS, SINUSES AND MASTOIDS: Mucosal thickening in the left frontal sinus and left anterior ethmoid air cells. Minimal mucosal thickening in the alveolar recesses of the maxillary sinuses. No air fluid levels. No periapical lucencies or evidence of significant dental caries. Dental amalgam noted. No acute abnormality. LUNGS AND MEDIASTINUM: No acute abnormality. BONES: Mild disc space narrowing at C3-C4. No focal bone abnormality. IMPRESSION: 1. No acute findings or discrete mass in the neck. 2. No evidence of edema along the right aspect of the  mandible or in the right facial soft tissues. 3. Mild degenerative disc space narrowing at C3-C4. Electronically signed by: Donnice Mania MD 09/29/2024 10:14 AM EST RP Workstation: [id number]     Procedures   Medications Ordered in the ED  lidocaine  (LIDODERM ) 5 % 1 patch (1 patch Transdermal Patch Applied 09/29/24 1209)  valACYclovir (VALTREX) tablet 1,000 mg (has no administration in time range)  gabapentin  (NEURONTIN ) capsule 300 mg (300 mg Oral Given 09/29/24 1209)  dexamethasone  (DECADRON ) injection 8 mg (8 mg Intramuscular Given 09/29/24 1227)  fentaNYL  (SUBLIMAZE ) injection 25 mcg (25 mcg Intravenous Given 09/29/24 1415)    Clinical Course as of 09/29/24 1532  Mon Sep 29, 2024  1504 Yesterday seen at [facility] for sore throat, given augmentin, viscous lido for sore throat, possible atypical strep. Unremarkable CT soft tissue neck. Seems more like nerve pain in neck / ear. Have given decadron , gabapentin , lidocaine  patch, fentanyl . Seems more like possible trigeminal neuralgia type pain. Recent shingles vaccine. Being treated for shingles without rash vs trigeminal neuralgia. DC with percocet, close PCP follow up. Just needs recheck of pain. [CP]    Clinical Course User Index [CP] Rosan Sherlean DEL, PA-C                                 Medical Decision Making Risk Prescription drug management.   This patient presents to the ED for concern of shooting pain in lateral right neck that radiates to the ear, this involves an extensive number of treatment options, and is a complaint that carries with it a high risk of complications and morbidity.  The differential diagnosis includes occipital neuralgia, trigeminal neuralgia, cervical radiculopathy, glossopharyngeal neuralgia, elongated styloid process and Eagle syndrome, TMJ disorder, shingles without a rash (zoster sine herpete) less likely atypical ACS related pain or mass defect.   Co morbidities / Chronic conditions that complicate the  patient evaluation  None   Additional history obtained:  Additional history obtained from EMR External records from outside source obtained and reviewed including note from urgent care yesterday detailing treatment with Augmentin for 10 days, viscous lidocaine  as needed and ketorolac  injection   Lab Tests:  I Ordered, and personally interpreted labs.  The pertinent results include: Labs largely unremarkable except for elevated hemoglobin of 15.2   Imaging Studies ordered:  I ordered imaging studies including CT that was negative for a mass or edema, consistent with degenerative disc disease in the cervical vertebrae I agree with the radiologist interpretation   Cardiac Monitoring: / EKG:  EKG normal sinus rhythm   Problem List / ED Course /  Critical interventions / Medication management  Likely neuralgia (trigeminal vs hypoglossal vs cervicogenic vs zoster sine herpete) given the sharp and shooting description of pain that is not exacerbated by any specific trigger but occurs randomly.  CT negative for a mass or right-sided edema.  Patient exam not concerning for infection and has not had improvement in symptoms with NSAIDs or antibiotics. EKG not consistent with ischemic changes less likely ACS related pain I ordered medication including 30 mg gabapentin , lidocaine  patch, 8 mg IM prednisone  injection. Patient tearful and in significant pain, will try 25 mg IV fentanyl  She had chickenpox when she was younger and got the shingles vaccine earlier this year.  Will go ahead and treat for shingles outbreak in case this is shingles without a rash.  1000 mg valacyclovir given in ED Reevaluation of the patient after these medicines showed that the patient she has now gone several minutes without having a painful sharp spasm pain, she feels like the fentanyl  is working I have reviewed the patients home medicines and have made adjustments as needed   Consultations Obtained:    Social  Determinants of Health:    Test / Admission - Considered:  3:32 PM Care of @PATIENTNAME @ transferred to [facility] and Dr. Pamella at the end of my shift as the patient will require reassessment once labs/imaging have resulted. Patient presentation, ED course, and plan of care discussed with review of all pertinent labs and imaging. Please see his/her note for further details regarding further ED course and disposition. Plan at time of handoff is discharge to home. This may be altered or completely changed at the discretion of the oncoming team pending results of further workup.      Final diagnoses:  None    ED Discharge Orders          Ordered    gabapentin  (NEURONTIN ) 300 MG capsule  3 times daily        09/29/24 1532    valACYclovir (VALTREX) 1000 MG tablet  3 times daily        09/29/24 1532    HYDROcodone-acetaminophen  (NORCO/VICODIN) 5-325 MG tablet  Every 8 hours PRN        09/29/24 1532               Charmayne Holmes, DO 09/29/24 1534    Charmayne Holmes, DO 09/29/24 1539    Garrick Charleston, MD 09/29/24 1558

## 2024-09-29 NOTE — ED Provider Triage Note (Signed)
 Emergency Medicine Provider Triage Evaluation Note  ELIA NUNLEY , a 53 y.o. female  was evaluated in triage.  Pt complains of R sided facial and anterior cervical pain radiating to R ear with R sided facial swelling x 3 days. Seen by UC and told ears are fine and to come get a CT. Resp panel negative at UC.   Endorses headaches (different from her migraines described as achy, throbby), lightheadedness, R sided tinnitus.   Denies fever, blurry vision, vertigo, dysphagia, odynophagia, dental pain, chest pain, shortness of breath, abdominal pain, vomiting, diarrhea, rashes, LE swelling.   Review of Systems  Positive: N/a Negative: N/a  Physical Exam  BP (!) 149/95 (BP Location: Right Arm)   Pulse 72   Temp (!) 97.3 F (36.3 C)   Resp 17   LMP 05/31/2016   SpO2 99%  Gen:   Awake, no distress   Resp:  Normal effort  MSK:   Moves extremities without difficulty  Other:  No pain or swelling over mastoid  Medical Decision Making  Medically screening exam initiated at 9:09 AM.  Appropriate orders placed.  Cinzia Devos Kasal was informed that the remainder of the evaluation will be completed by another provider, this initial triage assessment does not replace that evaluation, and the importance of remaining in the ED until their evaluation is complete.     Beola Derry [place], [place] 09/29/24 [phone]

## 2024-09-29 NOTE — ED Triage Notes (Signed)
 Pt is coming in for right sided mouth/throat pain she has been deal with for a couple days now. She mentions that's she went to UC who tested her for flu/covid/rsv/strep and will was negative, they sent her home with lidocaine  mouthwash and told her to return if the pain did not improve. She is no returning because the pain is not improving, and was told to request an CT of her neck by UC.

## 2024-09-29 NOTE — ED Provider Notes (Signed)
 Accepted handoff at shift change from [facility], DO. Please see prior provider note for more detail.   Briefly: Patient is 53 y.o.   DDX: concern for Yesterday seen at [facility] for sore throat, given augmentin, viscous lido for sore throat, possible atypical strep. Unremarkable CT soft tissue neck. Seems more like nerve pain in neck / ear. Have given decadron , gabapentin , lidocaine  patch, fentanyl . Seems more like possible trigeminal neuralgia type pain. Recent shingles vaccine. Being treated for shingles without rash vs trigeminal neuralgia. DC with percocet, close PCP follow up. Just needs recheck of pain.  Plan: On recheck pain still somewhat present but improving, will plan to transition to oral pain medicine, and discharged with gabapentin , Valtrex, and Vicodin.    Rosan Sherlean DEL, PA-C 09/29/24 1542    Pamella Ozell LABOR, DO 09/29/24 2327

## 2024-09-29 NOTE — Discharge Instructions (Addendum)
 Please make a follow up appointment with your PCP as soon as possible to have a definitive work up and management plan.   We have sent you home with pain medications: -Please take 300 mg gabapentin  three times daily -You may take hydrocodone-acetaminophen  every 8 hours as needed for pain  -These medications may make you drowsy, please use caution while driving, operating machinery and ambulating   We also sent you home with medications to treat a possible shingles outbreak: -Please take one 1000 mg Valtrex tablet twice tonight, 11/17, to complete three tablets on 11/17 -Then take one 1000 mg tablet three times daily from 11/18 until 11/23 to complete a 7 day course  Please come back to the ED if your symptoms acutely worsen or if you notice any neurological deficits such as extremity weakness, facial dropping or starting slurring your words.

## 2024-12-14 ENCOUNTER — Ambulatory Visit (HOSPITAL_COMMUNITY)

## 2024-12-14 ENCOUNTER — Other Ambulatory Visit: Payer: Self-pay

## 2024-12-14 ENCOUNTER — Encounter (HOSPITAL_COMMUNITY): Payer: Self-pay | Admitting: *Deleted

## 2024-12-14 ENCOUNTER — Ambulatory Visit (HOSPITAL_COMMUNITY)
Admission: EM | Admit: 2024-12-14 | Discharge: 2024-12-14 | Disposition: A | Discharge status: Home / Self Care | Attending: Family Medicine | Admitting: Family Medicine

## 2024-12-14 DIAGNOSIS — S76012A Strain of muscle, fascia and tendon of left hip, initial encounter: Secondary | ICD-10-CM | POA: Diagnosis not present

## 2024-12-14 DIAGNOSIS — M25562 Pain in left knee: Secondary | ICD-10-CM

## 2024-12-14 DIAGNOSIS — S8002XA Contusion of left knee, initial encounter: Secondary | ICD-10-CM | POA: Diagnosis not present

## 2024-12-14 MED ORDER — PREDNISONE 20 MG PO TABS: ORAL_TABLET | ORAL | 0 refills | Status: AC

## 2024-12-14 NOTE — ED Provider Notes (Signed)
 " [facility]  Note:  This document was prepared using Conservation officer, historic buildings and may include unintentional dictation errors.  MRN: [id number] DOB: [birth year]  Subjective:   Kelli Tran is a 54 y.o. female presenting for 3-day history of acute onset persistent severe left knee pain with difficulty bearing any kind of weight.  Symptoms started after she slipped and fell Friday night with her right foot wiping out in front of her.  She hyperextended her left leg at the level of her hip with her knee bent and made impact against the ground with her patella.  Reports that the pain radiates up and down her leg from her knee.  Has been using ibuprofen  consistently.  Has previously taken steroids as well.  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  Current Outpatient Medications  Medication Instructions   amLODipine  (NORVASC ) 5 mg, Every evening   amoxicillin -clavulanate (AUGMENTIN ) 875-125 MG tablet 1 tablet, Oral, Every 12 hours   gabapentin  (NEURONTIN ) 300 mg, Oral, 3 times daily   Guaifenesin  1,200 mg, Oral, 2 times daily   lidocaine  (XYLOCAINE ) 2 % solution 15 mLs, Mouth/Throat, Every 4 hours PRN   Multiple Vitamins-Minerals (MULTIVITAMIN ADULT PO) 1 tablet, Daily   pantoprazole  (PROTONIX ) 40 MG tablet Take by mouth.   pravastatin (PRAVACHOL) 10 mg, Daily at bedtime   tiZANidine (ZANAFLEX) 4 mg, 3 times daily PRN   triamcinolone  cream (KENALOG ) 0.1 % 1 application , Topical, 2 times daily    Allergies[1]  Past Medical History:  Diagnosis Date   Heart murmur    childhood   Hypertension    Migraine    Migraine aura, persistent, intractable, with status migrainosus 01/13/2020   Migraine without aura, with intractable migraine, so stated, without mention of status migrainosus 04/09/2014   Other and unspecified ovarian cysts      Past Surgical History:  Procedure Laterality Date   BREAST SURGERY     DIAGNOSTIC LAPAROSCOPY      ectopic   DILATION AND CURETTAGE OF UTERUS     MYOMECTOMY     ROBOTIC ASSISTED TOTAL HYSTERECTOMY WITH SALPINGECTOMY Bilateral 06/01/2016   Procedure: ROBOTIC ASSISTED TOTAL HYSTERECTOMY WITH BILATERAL SALPINGECTOMY;  Surgeon: Dickie Carder, MD;  Location: [facility];  Service: Gynecology;  Laterality: Bilateral;    Family History  Problem Relation Age of Onset   Hypertension Mother    Diabetes Mother    Migraines Mother    Yvone' disease Mother    Hypertension Father    Cancer Father    Kidney cancer Father    Breast cancer Sister    Hypertension Sister    Migraines Sister     Social History   Occupational History   Occupation: Engineer, Mining: [employer]  Tobacco Use   Smoking status: Never   Smokeless tobacco: Never  Vaping Use   Vaping status: Never Used  Substance and Sexual Activity   Alcohol use: Yes    Comment: occ   Drug use: No   Sexual activity: Not Currently     ROS   Objective:   Vitals: BP (!) 138/98   Pulse 76   Temp 98.7 F (37.1 C)   Resp 18   LMP 05/31/2016   SpO2 92%   Physical Exam Constitutional:      General: She is not in acute distress.    Appearance: Normal appearance. She is well-developed. She is not ill-appearing, toxic-appearing or diaphoretic.  HENT:  Head: Normocephalic and atraumatic.     Nose: Nose normal.     Mouth/Throat:     Mouth: Mucous membranes are moist.  Eyes:     General: No scleral icterus.       Right eye: No discharge.        Left eye: No discharge.     Extraocular Movements: Extraocular movements intact.  Cardiovascular:     Rate and Rhythm: Normal rate.  Pulmonary:     Effort: Pulmonary effort is normal.  Musculoskeletal:     Left knee: Swelling (trace) and bony tenderness present. No deformity, effusion, erythema, ecchymosis, lacerations or crepitus. Decreased range of motion. Tenderness present over the medial joint line, lateral joint line and patellar tendon. Normal alignment  and normal patellar mobility.  Skin:    General: Skin is warm and dry.  Neurological:     General: No focal deficit present.     Mental Status: She is alert and oriented to person, place, and time.  Psychiatric:        Mood and Affect: Mood normal.        Behavior: Behavior normal.    DG Knee Complete 4 Views Left Result Date: 12/14/2024 CLINICAL DATA:  Left knee pain after a fall. EXAM: LEFT KNEE - COMPLETE 4+ VIEW COMPARISON:  None Available. FINDINGS: No fracture or joint effusion.  No degenerative changes. IMPRESSION: Negative. Electronically Signed   By: Newell Eke M.D.   On: 12/14/2024 15:57   Applied a 4 inch Ace wrap to the left knee.  Patient provided with crutches.  Assessment and Plan :   PDMP not reviewed this encounter.  1. Acute pain of left knee   2. Hip strain, left, initial encounter   3. Contusion of left knee, initial encounter      Will manage for contusion of the left knee, hip strain of the left hip.  As she has failed NSAID treatment, offered an oral prednisone  course.  Recommended RICE method, use crutches to ambulate long distances.  Follow-up with her orthopedist. Counseled patient on potential for adverse effects with medications prescribed/recommended today, ER and return-to-clinic precautions discussed, patient verbalized understanding.      [1]  Allergies Allergen Reactions   Contrast Media [Iodinated Contrast Media] Nausea Only and Other (See Comments)    Caused burning.   Gadolinium Derivatives Nausea Only and Other (See Comments)    CAUSED BURNING   Levaquin [Levofloxacin In D5w] Other (See Comments)    inflammation burning sensation, generalized   Other Swelling    Walnuts causes swelling of the tongue.   Tessalon [Benzonatate] Swelling     Christopher Savannah, PA-C 12/14/24 1607  "

## 2024-12-14 NOTE — ED Triage Notes (Signed)
 PT reports pin in LT knee after she slipped and fell on Friday night. Pt reports the pain shoots up leg and also down to leg.
